# Patient Record
Sex: Male | Born: 1960 | Race: White | Hispanic: No | Marital: Single | Smoking: Never smoker
Health system: Southern US, Community
[De-identification: ages and names within clinical notes are randomized; demographics above are authoritative.]

## PROBLEM LIST (undated history)

## (undated) DIAGNOSIS — K4090 Unilateral inguinal hernia, without obstruction or gangrene, not specified as recurrent: Principal | ICD-10-CM

## (undated) DIAGNOSIS — I10 Essential (primary) hypertension: Secondary | ICD-10-CM

## (undated) HISTORY — DX: Essential (primary) hypertension: I10

## (undated) HISTORY — DX: Unilateral inguinal hernia, without obstruction or gangrene, not specified as recurrent: K40.90

---

## 2009-01-30 ENCOUNTER — Ambulatory Visit: Payer: Self-pay | Admitting: Internal Medicine

## 2009-02-24 ENCOUNTER — Ambulatory Visit: Payer: Self-pay | Admitting: Internal Medicine

## 2009-06-02 ENCOUNTER — Ambulatory Visit: Payer: Self-pay | Admitting: Internal Medicine

## 2009-10-21 ENCOUNTER — Ambulatory Visit: Payer: Self-pay | Admitting: Internal Medicine

## 2010-03-10 ENCOUNTER — Ambulatory Visit: Payer: Self-pay | Admitting: Internal Medicine

## 2010-09-18 ENCOUNTER — Ambulatory Visit: Payer: Self-pay | Admitting: Internal Medicine

## 2010-12-06 HISTORY — PX: INGUINAL HERNIA REPAIR: SUR1180

## 2010-12-06 HISTORY — PX: INCISIONAL HERNIA REPAIR: SHX193

## 2011-02-04 DIAGNOSIS — K4090 Unilateral inguinal hernia, without obstruction or gangrene, not specified as recurrent: Secondary | ICD-10-CM

## 2011-02-04 HISTORY — DX: Unilateral inguinal hernia, without obstruction or gangrene, not specified as recurrent: K40.90

## 2011-02-11 ENCOUNTER — Ambulatory Visit (INDEPENDENT_AMBULATORY_CARE_PROVIDER_SITE_OTHER): Payer: BC Managed Care – PPO | Admitting: Internal Medicine

## 2011-02-11 DIAGNOSIS — K4090 Unilateral inguinal hernia, without obstruction or gangrene, not specified as recurrent: Secondary | ICD-10-CM

## 2011-02-22 ENCOUNTER — Encounter: Payer: Self-pay | Admitting: *Deleted

## 2011-02-22 DIAGNOSIS — K4090 Unilateral inguinal hernia, without obstruction or gangrene, not specified as recurrent: Secondary | ICD-10-CM

## 2011-02-22 DIAGNOSIS — I10 Essential (primary) hypertension: Secondary | ICD-10-CM | POA: Insufficient documentation

## 2011-03-19 ENCOUNTER — Encounter (INDEPENDENT_AMBULATORY_CARE_PROVIDER_SITE_OTHER): Payer: BC Managed Care – PPO | Admitting: Internal Medicine

## 2011-03-19 DIAGNOSIS — Z0000 Encounter for general adult medical examination without abnormal findings: Secondary | ICD-10-CM

## 2011-04-22 ENCOUNTER — Telehealth: Payer: Self-pay | Admitting: Internal Medicine

## 2011-04-22 NOTE — Telephone Encounter (Signed)
Go to drug store and purchase a small bottle of Hibiclens. Lather up from neck down and leave on for 5 minutes. Shower night before surgery and day of surgery with this and continue using for a few days after surgery to prevent Staph infection

## 2011-04-22 NOTE — Telephone Encounter (Signed)
Spoke with patient and gave him directions per Dr. Lenord Fellers.  Pt denied any questions or concerns.

## 2011-04-27 ENCOUNTER — Other Ambulatory Visit: Payer: Self-pay | Admitting: General Surgery

## 2011-04-27 ENCOUNTER — Ambulatory Visit
Admission: RE | Admit: 2011-04-27 | Discharge: 2011-04-27 | Disposition: A | Discharge status: Home / Self Care | Payer: BC Managed Care – PPO | Source: Ambulatory Visit | Attending: General Surgery | Admitting: General Surgery

## 2011-04-27 DIAGNOSIS — Z01811 Encounter for preprocedural respiratory examination: Secondary | ICD-10-CM

## 2011-05-24 ENCOUNTER — Encounter (INDEPENDENT_AMBULATORY_CARE_PROVIDER_SITE_OTHER): Payer: Self-pay | Admitting: General Surgery

## 2011-06-03 ENCOUNTER — Encounter: Payer: Self-pay | Admitting: *Deleted

## 2011-10-21 ENCOUNTER — Ambulatory Visit (INDEPENDENT_AMBULATORY_CARE_PROVIDER_SITE_OTHER): Payer: BC Managed Care – PPO | Admitting: Internal Medicine

## 2011-10-21 DIAGNOSIS — Z23 Encounter for immunization: Secondary | ICD-10-CM

## 2011-12-14 ENCOUNTER — Other Ambulatory Visit: Payer: Self-pay | Admitting: Internal Medicine

## 2012-05-05 ENCOUNTER — Other Ambulatory Visit: Payer: Self-pay | Admitting: Internal Medicine

## 2012-07-25 IMAGING — CR DG CHEST 2 VIEW
2 series · 2 of 2 positions shown · non-contrast
Comparison: None

CLINICAL DATA: Preop hernia repair.

CHEST - 2 VIEW

[w chest pa]
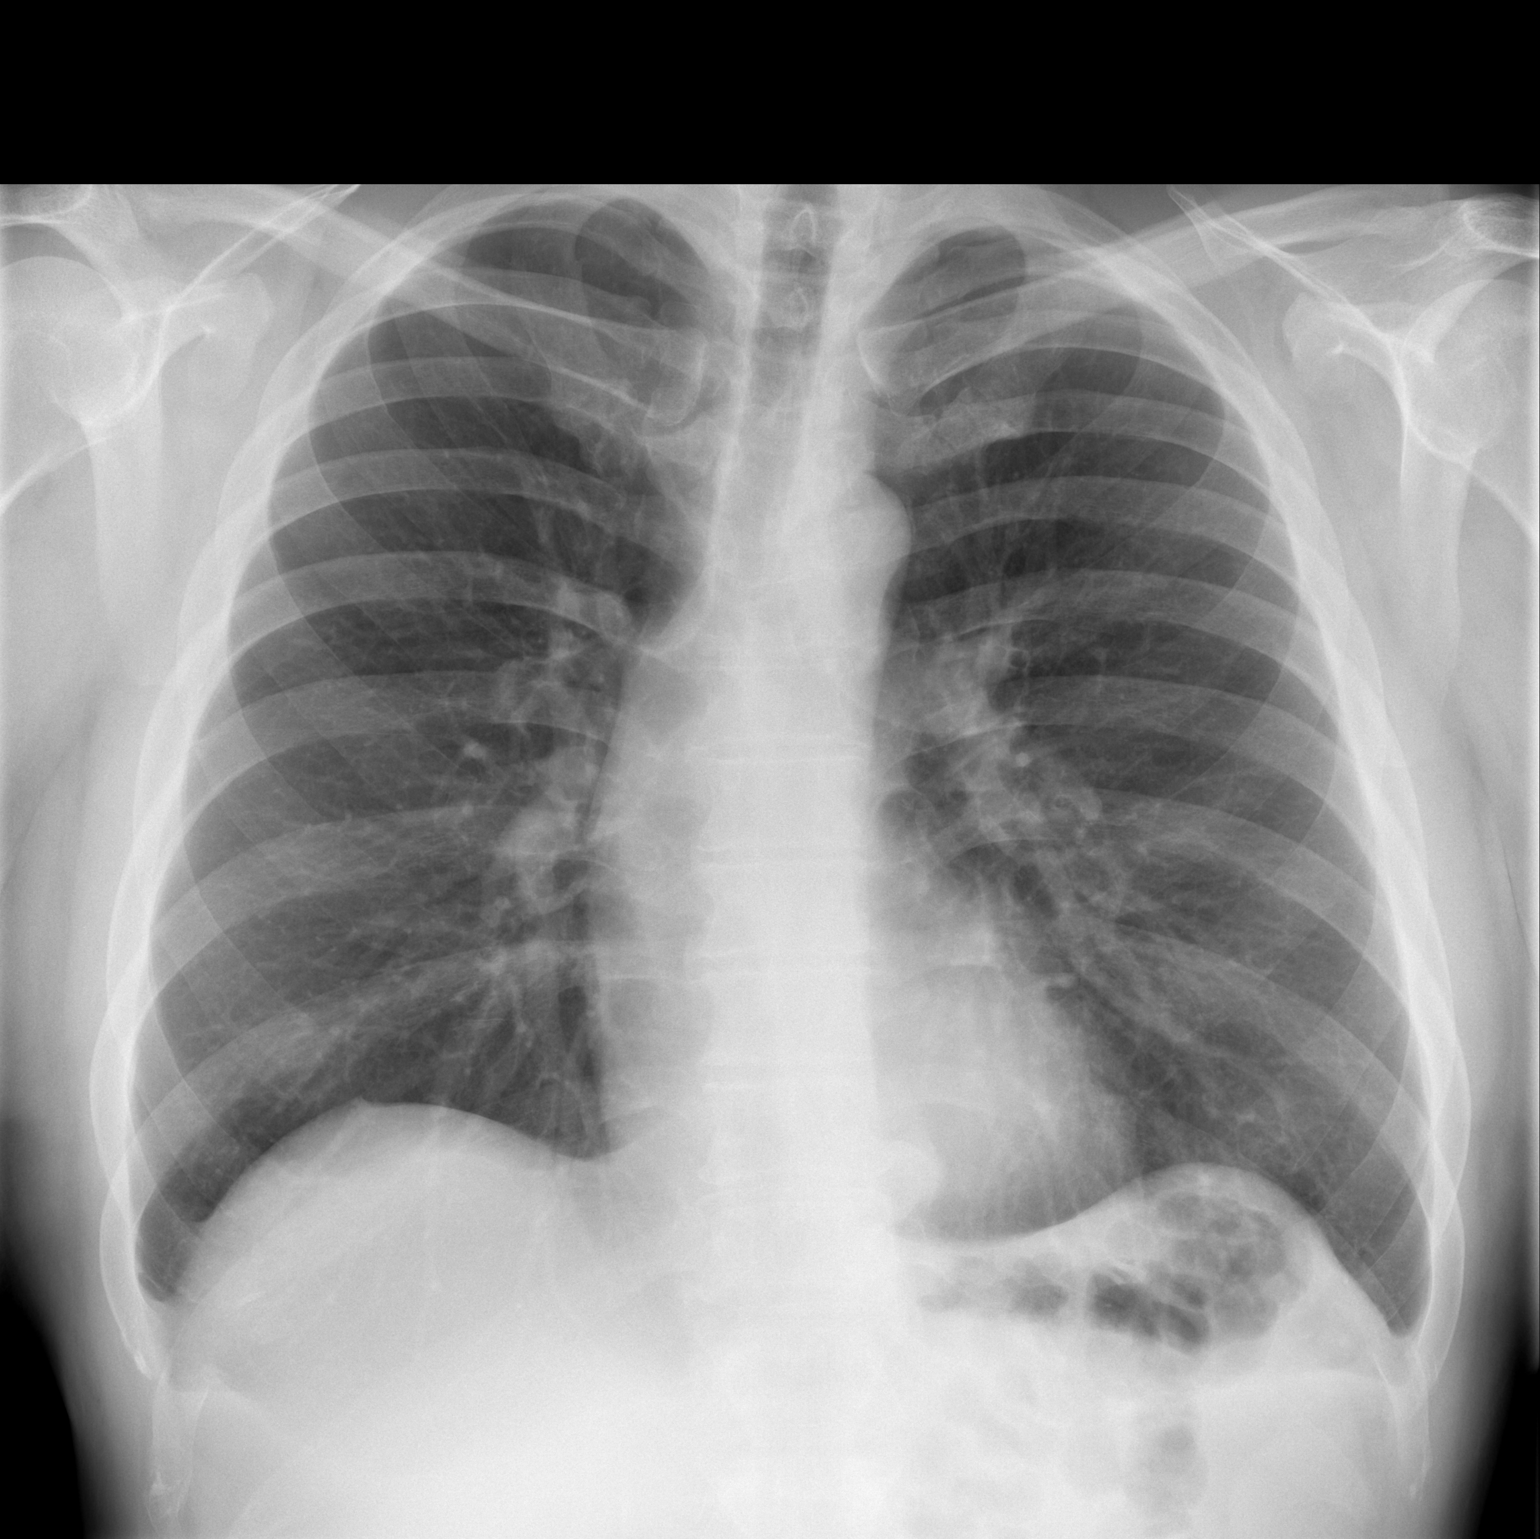

[w chest lat]
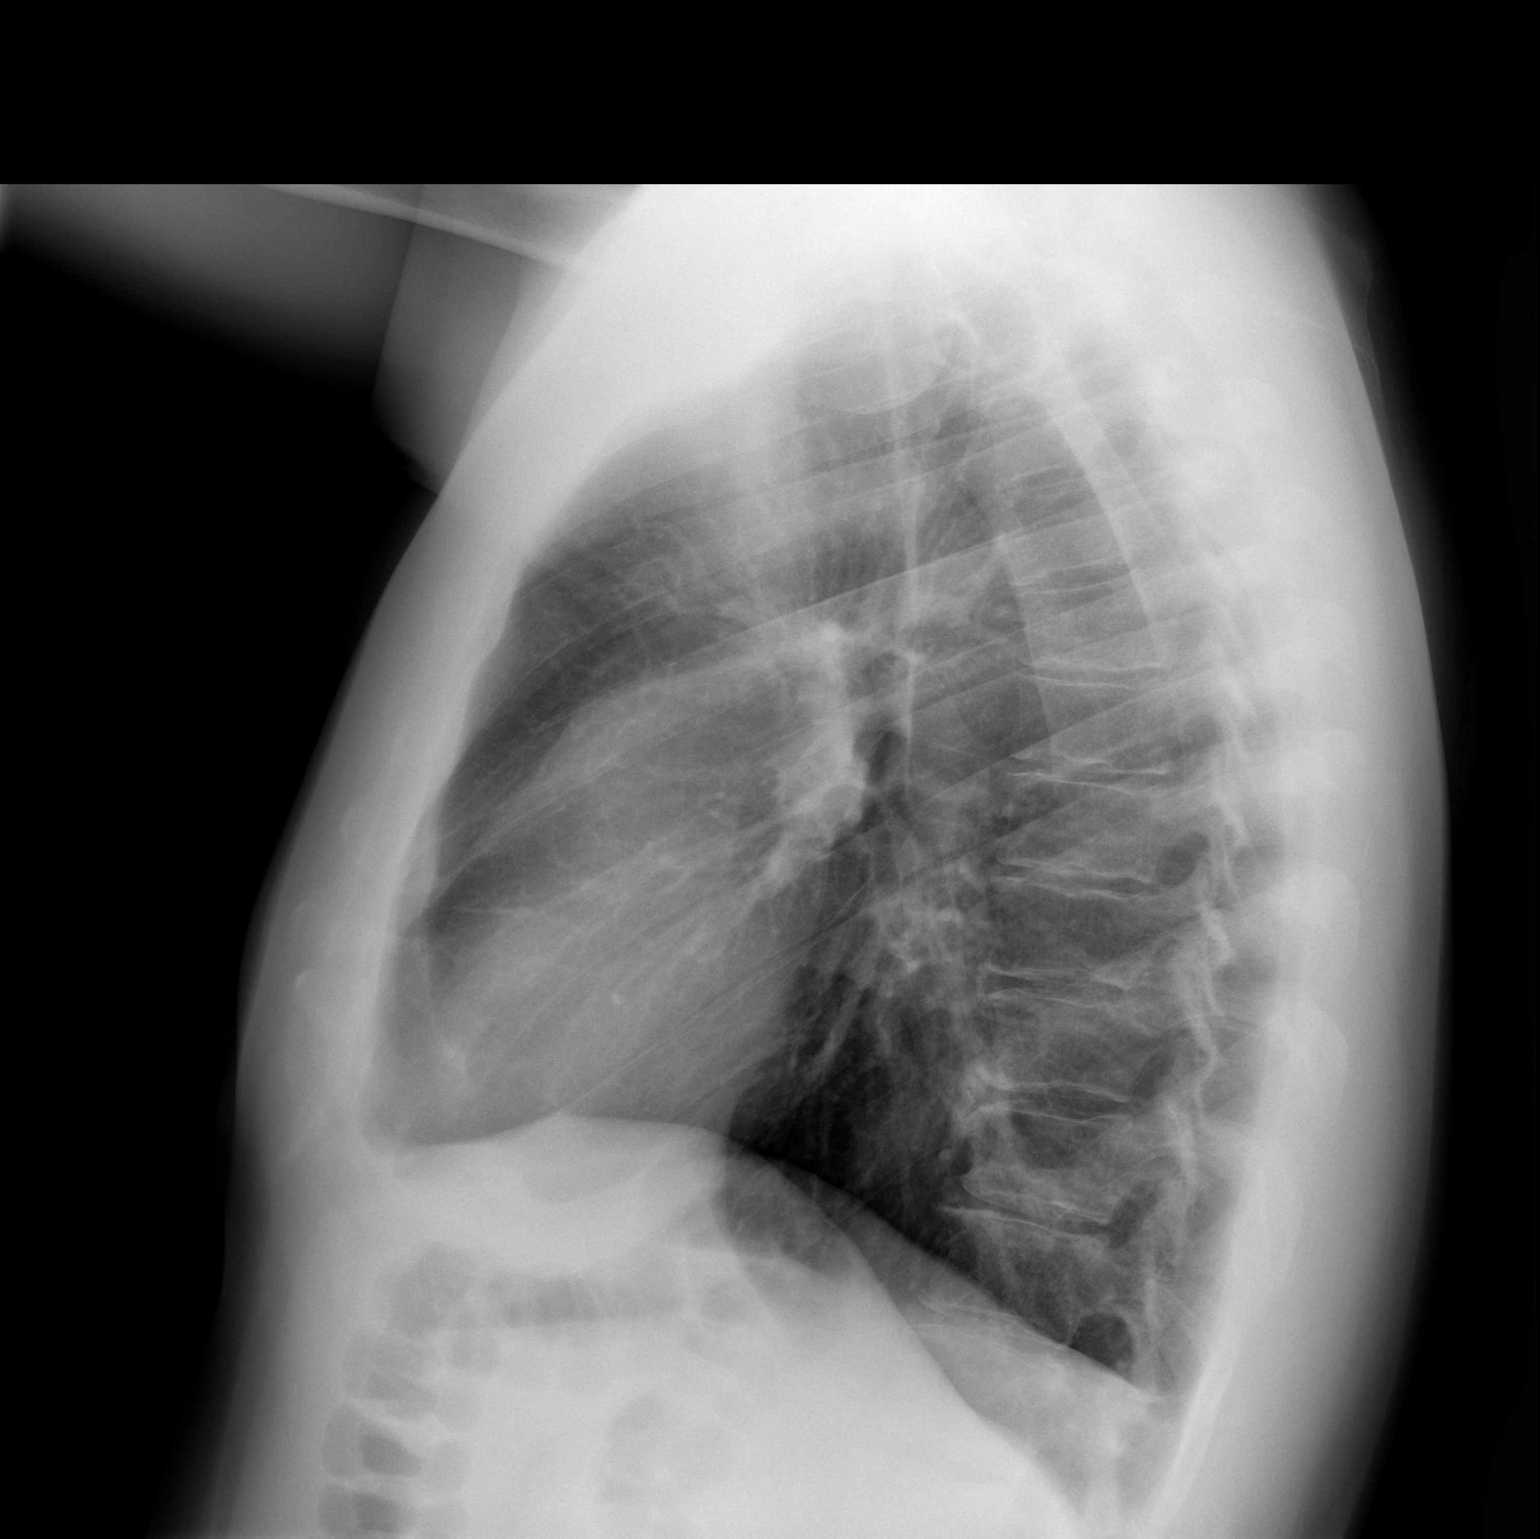

[2 of 2 positions shown; findings below may reference images not displayed]

FINDINGS: Heart and mediastinal contours are within normal limits.
No focal opacities or effusions.  No acute bony abnormality.
Slight rightward scoliosis of the thoracic spine.
IMPRESSION: No active cardiopulmonary disease.

## 2012-09-18 ENCOUNTER — Other Ambulatory Visit: Payer: Self-pay

## 2012-09-18 ENCOUNTER — Other Ambulatory Visit: Payer: Self-pay | Admitting: Internal Medicine

## 2012-09-18 MED ORDER — RAMIPRIL 5 MG PO CAPS: 5.0000 mg | ORAL_CAPSULE | Freq: Every day | ORAL | Status: DC

## 2012-09-18 NOTE — Telephone Encounter (Signed)
See when due for CPE

## 2012-10-05 ENCOUNTER — Other Ambulatory Visit: Payer: Self-pay | Admitting: Internal Medicine

## 2012-10-05 ENCOUNTER — Other Ambulatory Visit: Payer: BC Managed Care – PPO | Admitting: Internal Medicine

## 2012-10-05 DIAGNOSIS — I10 Essential (primary) hypertension: Secondary | ICD-10-CM

## 2012-10-05 DIAGNOSIS — Z125 Encounter for screening for malignant neoplasm of prostate: Secondary | ICD-10-CM

## 2012-10-05 LAB — CBC WITH DIFFERENTIAL/PLATELET
Eosinophils Relative: 1 % (ref 0–5)
HCT: 43.7 % (ref 39.0–52.0)
Lymphocytes Relative: 29 % (ref 12–46)
Lymphs Abs: 1.8 10*3/uL (ref 0.7–4.0)
MCV: 91.6 fL (ref 78.0–100.0)
Platelets: 226 10*3/uL (ref 150–400)
RBC: 4.77 MIL/uL (ref 4.22–5.81)
WBC: 6.2 10*3/uL (ref 4.0–10.5)

## 2012-10-05 LAB — COMPREHENSIVE METABOLIC PANEL
ALT: 14 U/L (ref 0–53)
Albumin: 4.5 g/dL (ref 3.5–5.2)
CO2: 25 mEq/L (ref 19–32)
Calcium: 8.8 mg/dL (ref 8.4–10.5)
Chloride: 100 mEq/L (ref 96–112)
Creat: 0.81 mg/dL (ref 0.50–1.35)
Total Protein: 6.6 g/dL (ref 6.0–8.3)

## 2012-10-05 LAB — LIPID PANEL
Cholesterol: 196 mg/dL (ref 0–200)
Total CHOL/HDL Ratio: 1.9 Ratio

## 2012-10-06 ENCOUNTER — Encounter: Payer: Self-pay | Admitting: Internal Medicine

## 2012-10-06 ENCOUNTER — Ambulatory Visit (INDEPENDENT_AMBULATORY_CARE_PROVIDER_SITE_OTHER): Payer: BC Managed Care – PPO | Admitting: Internal Medicine

## 2012-10-06 VITALS — BP 142/90 | HR 76 | Temp 98.3°F | Ht 71.0 in | Wt 210.0 lb

## 2012-10-06 DIAGNOSIS — I10 Essential (primary) hypertension: Secondary | ICD-10-CM

## 2012-10-06 DIAGNOSIS — Z23 Encounter for immunization: Secondary | ICD-10-CM

## 2012-10-06 LAB — POCT URINALYSIS DIPSTICK
Bilirubin, UA: NEGATIVE
Glucose, UA: NEGATIVE
Ketones, UA: NEGATIVE
Leukocytes, UA: NEGATIVE

## 2012-10-06 MED ORDER — TETANUS-DIPHTH-ACELL PERTUSSIS 5-2.5-18.5 LF-MCG/0.5 IM SUSP: 0.5000 mL | Freq: Once | INTRAMUSCULAR | Status: DC

## 2012-11-03 ENCOUNTER — Encounter: Payer: Self-pay | Admitting: Internal Medicine

## 2012-11-03 NOTE — Progress Notes (Signed)
  Subjective:    Patient ID: Matthew Huff, male    DOB: [birth year], 51 y.o.   MRN: [id number]  HPI 51 year old white male with history of hypertension in today for health maintenance exam. Tells me that his mother is doing poorly and is in hospice care because of end-stage congestive heart failure. His daughter is doing well. She is a Engineer, civil (consulting) at [employer] hospital in the neonatal unit. She likes it very much and recently became engaged. 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 He enjoys hiking for exercise. He's been doing that now for several years and lost a considerable amount of weight doing that. His father has dementia. Patient is divorced. Blood pressure is elevated today because he is worried about his mother. Generally blood pressure is excellent with a large cuff. He had left inguinal hernia repair March 2012.  He fractured right arm at 51 years of age and right ankle at 51 years of age. No known drug allergies. History of hyperlipidemia but once he lost weight he was able to discontinue Lipitor.  Family history: Mother has history of diabetes mellitus and coronary artery disease in addition to end-stage congestive heart failure  Father with history of hypertension arteriosclerotic cardiovascular disease and dementia  One sister overweight  Patient does not smoke. Social alcohol consumption.     Review of Systems  Constitutional: Negative.   All other systems reviewed and are negative.       Objective:   Physical Exam  Vitals reviewed. Constitutional: He is oriented to person, place, and time. He appears well-developed and well-nourished. No distress.  HENT:  Head: Normocephalic and atraumatic.  Right Ear: External ear normal.  Left Ear: External ear normal.  Mouth/Throat: Oropharynx is clear and moist.  Eyes: Conjunctivae normal and EOM are normal. Pupils are equal, round, and reactive to light. Right eye exhibits no discharge. Left eye  exhibits no discharge.  Neck: Neck supple. No JVD present. No tracheal deviation present.  Cardiovascular: Normal rate, regular rhythm, normal heart sounds and intact distal pulses.   No murmur heard. Pulmonary/Chest: Effort normal and breath sounds normal. No respiratory distress. He has no wheezes. He has no rales. He exhibits no tenderness.  Abdominal: Soft. Bowel sounds are normal.  Genitourinary: Prostate normal.       No hernias  Musculoskeletal: Normal range of motion. He exhibits no edema and no tenderness.  Lymphadenopathy:    He has no cervical adenopathy.  Neurological: He is alert and oriented to person, place, and time. He has normal reflexes. No cranial nerve deficit. Coordination normal.  Skin: Skin is warm and dry. No rash noted. He is not diaphoretic.  Psychiatric: He has a normal mood and affect. His behavior is normal. Judgment and thought content normal.          Assessment & Plan:  Hypertension  Grief reaction due to mother's illness with elevated blood pressure today because of grief  Plan: Return in 6 months. Patient is to monitor his blood pressure at home and call me if it remains elevated for prolonged period. Refill antihypertensive medication.

## 2012-11-03 NOTE — Patient Instructions (Addendum)
Continue antihypertensive medication and return in 6 months 

## 2013-01-10 ENCOUNTER — Telehealth: Payer: Self-pay | Admitting: Internal Medicine

## 2013-01-11 ENCOUNTER — Other Ambulatory Visit: Payer: Self-pay

## 2013-01-11 MED ORDER — RAMIPRIL 5 MG PO CAPS: 5.0000 mg | ORAL_CAPSULE | Freq: Every day | ORAL | Status: DC

## 2013-03-06 ENCOUNTER — Encounter: Payer: Self-pay | Admitting: Internal Medicine

## 2013-03-06 ENCOUNTER — Ambulatory Visit (INDEPENDENT_AMBULATORY_CARE_PROVIDER_SITE_OTHER): Payer: BC Managed Care – PPO | Admitting: Internal Medicine

## 2013-03-06 DIAGNOSIS — S61409A Unspecified open wound of unspecified hand, initial encounter: Secondary | ICD-10-CM

## 2013-03-06 DIAGNOSIS — S61452A Open bite of left hand, initial encounter: Secondary | ICD-10-CM

## 2013-03-06 DIAGNOSIS — W540XXA Bitten by dog, initial encounter: Secondary | ICD-10-CM

## 2013-03-06 MED ORDER — DOXYCYCLINE HYCLATE 100 MG PO TABS: 100.0000 mg | ORAL_TABLET | Freq: Two times a day (BID) | ORAL | Status: DC

## 2013-03-06 MED ORDER — MUPIROCIN 2 % EX OINT: TOPICAL_OINTMENT | Freq: Two times a day (BID) | CUTANEOUS | Status: DC

## 2013-03-06 NOTE — Patient Instructions (Addendum)
Clean wounds with peroxide daily. Soak left hand in warm soapy water for 20 minutes daily. Apply Bactroban ointment to puncture wounds and dress sterilely for work. Take doxycycline 100 mg twice daily for 10 days.

## 2013-03-06 NOTE — Progress Notes (Signed)
  Subjective:    Patient ID: Matthew Huff, male    DOB: 1961-02-13, 52 y.o.   MRN: [id number]  HPI Patient was bitten by family dog on Sunday, March 30. Dog's rabies vaccine is up-to-date. He cleaned it with peroxide and washed it well. It swelled up. 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 Able to do his work over the past couple of days. No drainage. No fever or chills. Tetanus immunization is up-to-date.    Review of Systems     Objective:   Physical Exam patient has a 1 cm laceration base of left thumb dorsal aspect of left hand. He also has a smaller puncture wound palmar aspect base of the left thumb. No increased warmth. There is swelling at the base of the thumb. Full range of motion in left thumb and muscle strength is normal. Pulse intact in wrist. Sensation intact in hand.        Assessment & Plan:  Dog bite left hand base of left thumb  Plan: Doxycycline 100 mg twice daily for 10 days. Clean area with peroxide daily and apply Bactroban ointment twice daily. Soak hand in warm soapy water for 20 minutes daily. Keep wounds dressed while at work. Prescribe Bactroban ointment to use on laceration and puncture wound twice daily.

## 2013-04-09 ENCOUNTER — Ambulatory Visit: Payer: BC Managed Care – PPO | Admitting: Internal Medicine

## 2013-08-15 ENCOUNTER — Telehealth: Payer: Self-pay | Admitting: Internal Medicine

## 2013-08-16 NOTE — Telephone Encounter (Signed)
Spoke with Chad @ [facility] [phone] per Dr. Beryle Quant order.  Sterapred DS as listed in note.  Advised patient of Rx.

## 2013-08-16 NOTE — Telephone Encounter (Signed)
Call in Sterapred DS 10 mg 6 day dosepack #21 tabs Take in 6-5-4-3-2-1- tapering dose.

## 2013-09-26 ENCOUNTER — Ambulatory Visit (INDEPENDENT_AMBULATORY_CARE_PROVIDER_SITE_OTHER): Payer: BC Managed Care – PPO | Admitting: Internal Medicine

## 2013-09-26 DIAGNOSIS — Z23 Encounter for immunization: Secondary | ICD-10-CM

## 2013-10-11 ENCOUNTER — Telehealth: Payer: Self-pay | Admitting: Internal Medicine

## 2013-10-11 NOTE — Telephone Encounter (Signed)
CPE Labs 12/11/13 @ 0900 CPE 12/13/13 @ 1100 Advised patient we'll refill the Ramipril until he sees her in January.    LM for pharmacy @ [phone] [facility] Ramipril 5mg .  Take 1 capsule by mouth daily.  #90, no refills.

## 2013-12-11 ENCOUNTER — Other Ambulatory Visit: Payer: BC Managed Care – PPO | Admitting: Internal Medicine

## 2013-12-11 DIAGNOSIS — Z13220 Encounter for screening for lipoid disorders: Secondary | ICD-10-CM

## 2013-12-11 DIAGNOSIS — I10 Essential (primary) hypertension: Secondary | ICD-10-CM

## 2013-12-11 DIAGNOSIS — Z130 Encounter for screening for diseases of the blood and blood-forming organs and certain disorders involving the immune mechanism: Secondary | ICD-10-CM

## 2013-12-11 DIAGNOSIS — Z125 Encounter for screening for malignant neoplasm of prostate: Secondary | ICD-10-CM

## 2013-12-11 LAB — CBC WITH DIFFERENTIAL/PLATELET
Basophils Absolute: 0 10*3/uL (ref 0.0–0.1)
Basophils Relative: 0 % (ref 0–1)
EOS ABS: 0.1 10*3/uL (ref 0.0–0.7)
EOS PCT: 1 % (ref 0–5)
HEMATOCRIT: 42.8 % (ref 39.0–52.0)
Hemoglobin: 14.8 g/dL (ref 13.0–17.0)
LYMPHS ABS: 1.7 10*3/uL (ref 0.7–4.0)
LYMPHS PCT: 21 % (ref 12–46)
MCH: 32.1 pg (ref 26.0–34.0)
MCHC: 34.6 g/dL (ref 30.0–36.0)
MCV: 92.8 fL (ref 78.0–100.0)
MONO ABS: 0.7 10*3/uL (ref 0.1–1.0)
Monocytes Relative: 9 % (ref 3–12)
Neutro Abs: 5.7 10*3/uL (ref 1.7–7.7)
Neutrophils Relative %: 69 % (ref 43–77)
Platelets: 284 10*3/uL (ref 150–400)
RBC: 4.61 MIL/uL (ref 4.22–5.81)
RDW: 12.7 % (ref 11.5–15.5)
WBC: 8.2 10*3/uL (ref 4.0–10.5)

## 2013-12-11 LAB — COMPREHENSIVE METABOLIC PANEL
ALT: 12 U/L (ref 0–53)
AST: 14 U/L (ref 0–37)
Albumin: 4.4 g/dL (ref 3.5–5.2)
Alkaline Phosphatase: 43 U/L (ref 39–117)
BILIRUBIN TOTAL: 0.5 mg/dL (ref 0.3–1.2)
BUN: 5 mg/dL — AB (ref 6–23)
CALCIUM: 8.8 mg/dL (ref 8.4–10.5)
CHLORIDE: 101 meq/L (ref 96–112)
CO2: 28 meq/L (ref 19–32)
CREATININE: 0.71 mg/dL (ref 0.50–1.35)
GLUCOSE: 85 mg/dL (ref 70–99)
Potassium: 3.7 mEq/L (ref 3.5–5.3)
Sodium: 139 mEq/L (ref 135–145)
Total Protein: 6.4 g/dL (ref 6.0–8.3)

## 2013-12-11 LAB — LIPID PANEL
CHOL/HDL RATIO: 2 ratio
CHOLESTEROL: 170 mg/dL (ref 0–200)
HDL: 84 mg/dL (ref >39)
LDL Cholesterol: 76 mg/dL (ref 0–99)
TRIGLYCERIDES: 51 mg/dL (ref <150)
VLDL: 10 mg/dL (ref 0–40)

## 2013-12-11 LAB — PSA: PSA: 1.9 ng/mL (ref <=4.00)

## 2013-12-13 ENCOUNTER — Encounter: Payer: Self-pay | Admitting: Internal Medicine

## 2013-12-13 ENCOUNTER — Ambulatory Visit (INDEPENDENT_AMBULATORY_CARE_PROVIDER_SITE_OTHER): Payer: BC Managed Care – PPO | Admitting: Internal Medicine

## 2013-12-13 VITALS — BP 160/100 | HR 80 | Temp 99.2°F | Ht 71.0 in | Wt 209.0 lb

## 2013-12-13 DIAGNOSIS — I10 Essential (primary) hypertension: Secondary | ICD-10-CM

## 2013-12-13 DIAGNOSIS — J111 Influenza due to unidentified influenza virus with other respiratory manifestations: Secondary | ICD-10-CM

## 2013-12-13 LAB — POCT URINALYSIS DIPSTICK
BILIRUBIN UA: NEGATIVE
Glucose, UA: NEGATIVE
KETONES UA: NEGATIVE
LEUKOCYTES UA: NEGATIVE
Nitrite, UA: NEGATIVE
PH UA: 7.5
Protein, UA: NEGATIVE
RBC UA: NEGATIVE
SPEC GRAV UA: 1.02
Urobilinogen, UA: NEGATIVE

## 2013-12-13 MED ORDER — BENZONATATE 100 MG PO CAPS: ORAL_CAPSULE | ORAL | Status: DC

## 2013-12-13 MED ORDER — AZITHROMYCIN 250 MG PO TABS
ORAL_TABLET | ORAL | Status: DC
Start: 2013-12-13 — End: 2014-06-03

## 2013-12-13 NOTE — Patient Instructions (Addendum)
Call with BP readings in 2 weeks. Take Levaquin and Tessalon perles as directed. Consider screening colonoscopy.

## 2014-01-17 ENCOUNTER — Other Ambulatory Visit: Payer: Self-pay | Admitting: Internal Medicine

## 2014-06-03 ENCOUNTER — Encounter: Payer: Self-pay | Admitting: Internal Medicine

## 2014-06-03 ENCOUNTER — Ambulatory Visit (INDEPENDENT_AMBULATORY_CARE_PROVIDER_SITE_OTHER): Payer: BC Managed Care – PPO | Admitting: Internal Medicine

## 2014-06-03 VITALS — BP 150/88 | HR 92 | Temp 98.9°F | Wt 206.0 lb

## 2014-06-03 DIAGNOSIS — S91309A Unspecified open wound, unspecified foot, initial encounter: Secondary | ICD-10-CM

## 2014-06-03 DIAGNOSIS — I10 Essential (primary) hypertension: Secondary | ICD-10-CM

## 2014-06-03 DIAGNOSIS — S91332A Puncture wound without foreign body, left foot, initial encounter: Secondary | ICD-10-CM

## 2014-06-03 DIAGNOSIS — F4321 Adjustment disorder with depressed mood: Secondary | ICD-10-CM

## 2014-06-03 MED ORDER — LEVOFLOXACIN 500 MG PO TABS: 500.0000 mg | ORAL_TABLET | Freq: Every day | ORAL | Status: DC

## 2014-06-03 NOTE — Progress Notes (Signed)
   Subjective:    Patient ID: Matthew Huff, male    DOB: 1961/11/05, 53 y.o.   MRN: [id number]  HPI  Patient accidentally dropped a jar of jelly out of his refrigerator onto the floor at home on Thursday, June 25. Thinks he may have stepped on a piece of glass. Has puncture one in left heel plantar aspect. Doesn't feel that a piece of glass is left in there but became concerned about the puncture wound. His tetanus immunization is up-to-date.  Father died about a month ago due to complications of heart block, bowel obstruction and pneumonia. He was 52 years old.  Patient has not been taking antihypertensive medication regularly. Did not take it this past weekend and blood pressure is elevated today.    Review of Systems     Objective:   Physical Exam With a scalpel, I pared the puncture area down somewhat and did not find any piece of glass. Pt did not feel any piece of glass to his touch either. No evidence of abscess.       Assessment & Plan:  Puncture wound left heel  Hypertension- take blood pressure medicine regularly and continue to monitor and home. Call if persistently elevated on current medication  Grief reaction due to loss of father  Plan: Levaquin 500 milligrams daily for 7 days. He is to soak left foot in warm soapy water for 20 minutes daily. Keep puncture area covered and clean for 5-7 days. Tetanus immunization is up-to-date.

## 2014-06-03 NOTE — Patient Instructions (Addendum)
Keep wound clean and dry and covered for several days. Take Levaquin 500 milligrams daily for 7 days with a meal. Soak left foot in warm soapy water for 20 minutes daily. Tetanus immunization is up-to-date. Take blood pressure medication on a regular basis.

## 2014-06-11 ENCOUNTER — Encounter: Payer: Self-pay | Admitting: Internal Medicine

## 2014-06-11 NOTE — Progress Notes (Signed)
   Subjective:    Patient ID: Matthew Huff, male    DOB: [birth year], 53 y.o.   MRN: [id number]  HPI  53 year old White male in today for health maintenance exam. He has a history of hypertension treated with Ramipril 5 mg daily. Blood pressure is elevated today but he's not feeling well. He succumbed down with flu symptoms and respiratory infection. Says blood pressures been under good control when he checked it at home over the past several weeks.  Past medical history: Fractured right arm in 53 years of age. Fractured right ankle at 53 years of age. Used to be a Lipitor for hyperlipidemia but once he lost weight by diet and exercise he was able to discontinue Lipitor. Left inguinal hernia repair March 2012.  Social history: He is divorced. 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 Enjoys hiking for exercise. One daughter who is a Engineer, civil (consulting)nurse. Patient does not smoke. Social alcohol consumption.  Family history: Mother died of end-stage congestive heart failure. Father with history of dementia and hypertensive arteriosclerotic cardiovascular disease. One sister overweight.      Review of Systems  Constitutional: Positive for fever.  HENT: Positive for congestion.   Respiratory: Positive for cough.   Cardiovascular: Negative.   Gastrointestinal: Negative.   Genitourinary: Negative.   Neurological: Negative.   Hematological: Negative.   Psychiatric/Behavioral: Negative.        Objective:   Physical Exam  Vitals reviewed. Constitutional: He is oriented to person, place, and time. He appears well-developed and well-nourished. No distress.  HENT:  Head: Normocephalic and atraumatic.  Right Ear: External ear normal.  Left Ear: External ear normal.  Mouth/Throat: Oropharynx is clear and moist. No oropharyngeal exudate.  Eyes: Conjunctivae and EOM are normal. Pupils are equal, round, and reactive to light. Right eye exhibits no discharge. Left eye exhibits no discharge. No  scleral icterus.  Neck: Neck supple. No JVD present. No thyromegaly present.  Cardiovascular: Normal rate, regular rhythm, normal heart sounds and intact distal pulses.   No murmur heard. Pulmonary/Chest: Effort normal and breath sounds normal. He has no wheezes. He has no rales.  Abdominal: Soft. Bowel sounds are normal. He exhibits no distension and no mass. There is no tenderness. There is no rebound and no guarding.  Genitourinary: Prostate normal.  Musculoskeletal: Normal range of motion. He exhibits no edema.  Neurological: He is alert and oriented to person, place, and time. He has normal reflexes. No cranial nerve deficit. Coordination normal.  Skin: Skin is warm and dry. No rash noted. He is not diaphoretic.  Psychiatric: He has a normal mood and affect. His behavior is normal. Judgment and thought content normal.          Assessment & Plan:  Influenza with secondary bacterial infection/bronchitis  Hypertension-blood pressure  elevated today because of illness  Plan: Patient is to call with blood pressure readings in the next 2 weeks. Continue Ramipril 5 mg daily. Levaquin 500 milligrams daily for bronchitis and Tessalon Perles 200 mg 3 times daily as needed for cough.

## 2014-06-20 ENCOUNTER — Ambulatory Visit: Payer: BC Managed Care – PPO | Admitting: Internal Medicine

## 2014-06-24 ENCOUNTER — Encounter: Payer: Self-pay | Admitting: Internal Medicine

## 2014-06-24 ENCOUNTER — Ambulatory Visit (INDEPENDENT_AMBULATORY_CARE_PROVIDER_SITE_OTHER): Payer: BC Managed Care – PPO | Admitting: Internal Medicine

## 2014-06-24 VITALS — BP 144/94 | HR 76 | Temp 98.7°F | Wt 206.5 lb

## 2014-06-24 DIAGNOSIS — I10 Essential (primary) hypertension: Secondary | ICD-10-CM

## 2014-06-24 DIAGNOSIS — F4321 Adjustment disorder with depressed mood: Secondary | ICD-10-CM

## 2014-06-24 DIAGNOSIS — F4320 Adjustment disorder, unspecified: Secondary | ICD-10-CM

## 2014-06-24 MED ORDER — RAMIPRIL 10 MG PO CAPS: 10.0000 mg | ORAL_CAPSULE | Freq: Every day | ORAL | Status: DC

## 2014-06-24 NOTE — Progress Notes (Signed)
   Subjective:    Patient ID: Matthew Huff, male    DOB: [birth year], 53 y.o.   MRN: [id number]  HPI At last visit he felt like there might be a piece of glass in his heel. The area was pared down but no foreign body located. He changed inserts in his shoes andsymptoms improved. Continues to hike on weekends. This past weekend went up to 2000 feet. His been riding his bike.still grieving the loss of his father recently. His been trying to clean out father's home. His blood pressure is elevated today. He's been on same dose of Ramapril for long period of time. He is compliant with his medication.    Review of Systems     Objective:   Physical Exam not examined today spoke with patient for 15 minutes regarding blood pressure management        Assessment & Plan:  Essential hypertension  Grief reaction  Plan: Increase Ramapril to 10 mg daily and return in 3 weeks for office visit blood pressure check and basic metabolic panel

## 2014-06-24 NOTE — Patient Instructions (Signed)
Increase Altace to 10 mg daily and return in 3 weeks for office visit b- met and blood pressure check

## 2014-07-25 ENCOUNTER — Ambulatory Visit (INDEPENDENT_AMBULATORY_CARE_PROVIDER_SITE_OTHER): Payer: BC Managed Care – PPO | Admitting: Internal Medicine

## 2014-07-25 ENCOUNTER — Encounter: Payer: Self-pay | Admitting: Internal Medicine

## 2014-07-25 VITALS — BP 130/90 | Wt 209.0 lb

## 2014-07-25 DIAGNOSIS — I10 Essential (primary) hypertension: Secondary | ICD-10-CM

## 2014-07-25 NOTE — Progress Notes (Signed)
   Subjective:    Patient ID: Matthew Huff, male    DOB: 04-27-1961, 53 y.o.   MRN: [id number]  HPI  53 year old male in today for repeat blood pressure check. At last visit we increased Altace to 10 mg daily. He still grieving his father's death. His dog has medical problems and is 52 years old. He may have to put her down. He is grieving that as well. Hasn't been exercising quite as much the last few weeks hands. His weight has increased a bit.    Review of Systems     Objective:   Physical Exam  Talked with him for 15 minutes about grief reaction      Assessment & Plan:  Grief reaction  Hypertension  Plan: He says he feels a bit upset today. He came in during work and is going back to work. I did not change his medication today and he will return in 8 weeks for followup.

## 2014-07-25 NOTE — Patient Instructions (Signed)
Continue same medication Altace 10 mg daily return in 8 weeks.

## 2014-07-26 ENCOUNTER — Other Ambulatory Visit: Payer: Self-pay | Admitting: Internal Medicine

## 2014-07-26 MED ORDER — RAMIPRIL 10 MG PO CAPS: 10.0000 mg | ORAL_CAPSULE | Freq: Every day | ORAL | Status: DC

## 2014-07-26 NOTE — Telephone Encounter (Signed)
Change dose to 10 mg daily #90 with no refill

## 2014-09-19 ENCOUNTER — Encounter: Payer: Self-pay | Admitting: Internal Medicine

## 2014-09-19 ENCOUNTER — Ambulatory Visit (INDEPENDENT_AMBULATORY_CARE_PROVIDER_SITE_OTHER): Payer: BC Managed Care – PPO | Admitting: Internal Medicine

## 2014-09-19 VITALS — BP 140/90

## 2014-09-19 DIAGNOSIS — I10 Essential (primary) hypertension: Secondary | ICD-10-CM

## 2014-09-19 DIAGNOSIS — M25511 Pain in right shoulder: Secondary | ICD-10-CM

## 2014-09-19 DIAGNOSIS — M25512 Pain in left shoulder: Secondary | ICD-10-CM | POA: Insufficient documentation

## 2014-09-19 MED ORDER — MELOXICAM 15 MG PO TABS: 15.0000 mg | ORAL_TABLET | Freq: Every day | ORAL | Status: DC

## 2014-09-19 MED ORDER — AMLODIPINE BESYLATE 5 MG PO TABS: 5.0000 mg | ORAL_TABLET | Freq: Every day | ORAL | Status: DC

## 2014-09-19 NOTE — Progress Notes (Signed)
   Subjective:    Patient ID: Matthew PigeonRalph Edward Huff, male    DOB: [birth year], 53 y.o.   MRN: [id number]  HPI  Patient in today for blood pressure check. Blood pressure remains elevated. He is on Ramipril. Still working hard on weekends to clean out father's house. Still grieving father's death. Says he is drinking a lot of water. Having some issues with bilateral shoulder pain left worse than the right. 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 Occasionally will take over-the-counter analgesic. He does a fair amount of hiking.    Review of Systems     Objective:   Physical Exam  spent 15 minutes speaking with patient about these issues. Have commenced him to try an additional blood pressure medication.       Assessment & Plan:  Hypertension-add Norvasc 5 mg daily to Ramipril. Return in 2 months.  Bilateral shoulder pain left worse than right-begin Mobic 15 mg daily  Plan: Continue Ramipril 10 mg daily. Add Norvasc 5 mg daily. Take Mobic 15 mg daily for shoulder pain

## 2014-09-19 NOTE — Patient Instructions (Signed)
Start Norvasc 5 mg daily in addition to Altace 10 mg daily. Take Mobic 15 mg daily for left shoulder pain. Return in December. No lab work needed upon return.

## 2014-10-24 ENCOUNTER — Other Ambulatory Visit: Payer: Self-pay | Admitting: Internal Medicine

## 2014-10-29 ENCOUNTER — Ambulatory Visit (INDEPENDENT_AMBULATORY_CARE_PROVIDER_SITE_OTHER): Payer: BC Managed Care – PPO | Admitting: Internal Medicine

## 2014-10-29 DIAGNOSIS — Z23 Encounter for immunization: Secondary | ICD-10-CM

## 2014-11-26 ENCOUNTER — Ambulatory Visit (INDEPENDENT_AMBULATORY_CARE_PROVIDER_SITE_OTHER): Payer: BC Managed Care – PPO | Admitting: Internal Medicine

## 2014-11-26 ENCOUNTER — Encounter: Payer: Self-pay | Admitting: Internal Medicine

## 2014-11-26 VITALS — BP 166/94 | HR 78 | Temp 98.4°F

## 2014-11-26 DIAGNOSIS — I10 Essential (primary) hypertension: Secondary | ICD-10-CM

## 2014-11-26 DIAGNOSIS — M25512 Pain in left shoulder: Secondary | ICD-10-CM

## 2014-11-26 MED ORDER — MELOXICAM 15 MG PO TABS: 15.0000 mg | ORAL_TABLET | Freq: Every day | ORAL | Status: DC

## 2014-11-26 NOTE — Patient Instructions (Signed)
Take amlodipine and Meloxicam. Continue BP meds previously prescribed.

## 2014-12-13 ENCOUNTER — Other Ambulatory Visit: Payer: Self-pay | Admitting: *Deleted

## 2014-12-13 MED ORDER — RAMIPRIL 10 MG PO CAPS: 10.0000 mg | ORAL_CAPSULE | Freq: Every day | ORAL | Status: DC

## 2014-12-13 MED ORDER — AMLODIPINE BESYLATE 5 MG PO TABS: 5.0000 mg | ORAL_TABLET | Freq: Every day | ORAL | Status: DC

## 2014-12-13 NOTE — Telephone Encounter (Signed)
Patient states pharmacy said they had not received refills on his amlodipine or ramipril we sent refills on 11/26/14 confirmed with pateint correct pharmacy resent refills

## 2015-01-22 ENCOUNTER — Ambulatory Visit: Payer: BC Managed Care – PPO | Admitting: Internal Medicine

## 2015-01-28 ENCOUNTER — Encounter: Payer: Self-pay | Admitting: Internal Medicine

## 2015-01-28 ENCOUNTER — Ambulatory Visit (INDEPENDENT_AMBULATORY_CARE_PROVIDER_SITE_OTHER): Payer: BLUE CROSS/BLUE SHIELD | Admitting: Internal Medicine

## 2015-01-28 VITALS — BP 140/90 | Wt 216.0 lb

## 2015-01-28 DIAGNOSIS — I10 Essential (primary) hypertension: Secondary | ICD-10-CM

## 2015-01-28 DIAGNOSIS — M25512 Pain in left shoulder: Secondary | ICD-10-CM

## 2015-01-29 ENCOUNTER — Encounter: Payer: Self-pay | Admitting: Internal Medicine

## 2015-01-29 NOTE — Progress Notes (Signed)
   Subjective:    Patient ID: Matthew PigeonRalph Edward Badley, male    DOB: 1961/09/04, 54 y.o.   MRN: [id number]  HPI  54 year old male in today for follow-up of hypertension. He is on Ramapril 10 mg daily. His father died and patient is been busy cleaning out father's house on weekends. He is not getting exercise and hike is much as he would like to. Blood pressure is elevated today. Still has some grief reaction from death of father.  Also having some left shoulder pain which is been aggravating him particularly at work. He works as a Curatormechanic.    Review of Systems     Objective:   Physical Exam  Skin warm and dry. Nodes none. Neck supple without JVD thyromegaly or carotid bruits. Chest clear to auscultation. Cardiac exam: Regular rate and rhythm without ectopy. Extremities without edema. Good range of motion in left shoulder without crepitus    Assessment & Plan:  Hypertension-elevated today. Needs to monitor at home.  Grief reaction secondary to loss father  Shoulder pain-likely musculoskeletal pain  Plan: Meloxicam 15 mg daily. Continue Ramipril 10 mg daily. Add amlodipine 5 mg daily. Return in February for follow-up. Try get more exercise and watch diet.

## 2015-01-29 NOTE — Progress Notes (Signed)
   Subjective:    Patient ID: Matthew PigeonRalph Edward Huff, male    DOB: May 24, 1961, 54 y.o.   MRN: [id number]  HPI He finally finished cleaning out his father's house. He went hiking last weekend. Blood pressure remains elevated despite being on amlodipine 5 mg daily and Ramapo real 10 mg daily. We discussed these medications, how they work, side effects of what options we could add if necessary. He really doesn't want to add any medication at this point in time. He would like to try to get back into a regular exercise regimen. He has gained some weight. Weight today is 216 pounds.  History of left shoulder pain treated with meloxicam.    Review of Systems     Objective:   Physical Exam  Neck is supple without JVD thyromegaly or carotid bruits. Chest clear to auscultation. Cardiac exam regular rate and rhythm normal S1 and S2. Extremities without edema      Assessment & Plan:  Essential hypertension  Plan: Patient will return in 4-6 weeks for recheck and reevaluation. Continue amlodipine and Ramapo at same dosages for now. Shoulder pain stable on meloxicam.

## 2015-01-29 NOTE — Patient Instructions (Signed)
Continue same medications and return in 4-6 weeks

## 2015-02-25 ENCOUNTER — Encounter: Payer: Self-pay | Admitting: Internal Medicine

## 2015-02-25 ENCOUNTER — Ambulatory Visit (INDEPENDENT_AMBULATORY_CARE_PROVIDER_SITE_OTHER): Payer: BLUE CROSS/BLUE SHIELD | Admitting: Internal Medicine

## 2015-02-25 VITALS — BP 140/80 | HR 91 | Resp 14 | Wt 216.0 lb

## 2015-02-25 DIAGNOSIS — W57XXXD Bitten or stung by nonvenomous insect and other nonvenomous arthropods, subsequent encounter: Secondary | ICD-10-CM

## 2015-02-25 DIAGNOSIS — S50861D Insect bite (nonvenomous) of right forearm, subsequent encounter: Secondary | ICD-10-CM

## 2015-02-25 DIAGNOSIS — I10 Essential (primary) hypertension: Secondary | ICD-10-CM | POA: Diagnosis not present

## 2015-02-25 NOTE — Progress Notes (Signed)
   Subjective:    Patient ID: Matthew Huff, male    DOB: [birth year], 54 y.o.   MRN: [id number]  HPI 54 year old Male in today for blood pressure check. Initially his blood pressure is elevated when he first got to the office but he was late for his appointment and apparently he been having a bad day at work. He doesn't really want to change blood pressure medications at this point in time. I rechecked his blood pressure was 140/80. He was to get back into hiking. Hasn't had much time for exercise. Still grieving loss of father. He's been walking some on the weekends which helps his weight and stress. He attends church. He would like to be a committed relationship but hasn't really met anybody that he felt he was compatible with.  This past weekend he was visiting his daughter in [place] and helping her do some yard work. An insect bit him on the right forearm volar aspect. He went to a [facility] and was prescribed Keflex which he did not take. Says the erythema has decreased considerably.    Review of Systems     Objective:   Physical Exam  Spent 20 minutes speaking with patient about all of these issues. He doesn't want to change antihypertensive medication at this point in time. Discussed options including changing Ramapril to losartan HCTZ and continuing amlodipine 5 mg daily. I'm reluctant to increase amlodipine to 10 mg daily as it may cause some edema. Has insect bite right forearm mid volar aspect which is improving. No significant erythema or swelling     Assessment & Plan:  Essential hypertension  Insect bite right forearm-improving  Grief reaction with  loss of father  Plan: He'll return in 3 months for physical exam and fasting lab work. Continue same medications.

## 2015-02-25 NOTE — Patient Instructions (Signed)
Continue same medications and return in 3 months office visit and physical examination with fasting lab work

## 2015-03-10 ENCOUNTER — Ambulatory Visit (INDEPENDENT_AMBULATORY_CARE_PROVIDER_SITE_OTHER): Payer: BLUE CROSS/BLUE SHIELD | Admitting: Internal Medicine

## 2015-03-10 ENCOUNTER — Encounter: Payer: Self-pay | Admitting: Internal Medicine

## 2015-03-10 VITALS — BP 140/90 | HR 88 | Temp 97.4°F | Wt 214.0 lb

## 2015-03-10 DIAGNOSIS — M12819 Other specific arthropathies, not elsewhere classified, unspecified shoulder: Secondary | ICD-10-CM

## 2015-03-10 DIAGNOSIS — M19012 Primary osteoarthritis, left shoulder: Secondary | ICD-10-CM

## 2015-03-10 DIAGNOSIS — I10 Essential (primary) hypertension: Secondary | ICD-10-CM

## 2015-03-10 NOTE — Progress Notes (Signed)
   Subjective:    Patient ID: Matthew PigeonRalph Edward Huff, male    DOB: 07-Apr-1961, 54 y.o.   MRN: [id number]  HPI Patient in today with left shoulder pain. He is on Mobic 15 mg daily. 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 Felt pain in his left deltoid muscle area. Had immediate pain and decreased range of motion in the left upper extremity. Could not get left arm up over his head.     Review of Systems     Objective:   Physical Exam  Blood pressure is elevated secondary to pain. Popping in the humeral joint with range of motion exercises. Decreased range of motion. Cannot get left arm up over his head without extreme pain.      Assessment & Plan:   Left shoulder arthropathy  Essential hypertension  Plan: Appointment with orthopedist for further evaluation. Continue Mobic 15 mg daily.

## 2015-03-10 NOTE — Patient Instructions (Signed)
Shoulder injected with Depo-Medrol Marcaine and Xylocaine. Continue range of motion exercises. Call if not better in 48 hours or sooner if worse.

## 2015-03-10 NOTE — Addendum Note (Signed)
Addended by: Margaree MackintoshBAXLEY, MARY J on: 03/10/2015 10:42 AM   Modules accepted: Level of Service

## 2015-04-29 ENCOUNTER — Encounter: Payer: Self-pay | Admitting: Internal Medicine

## 2015-04-29 ENCOUNTER — Ambulatory Visit (INDEPENDENT_AMBULATORY_CARE_PROVIDER_SITE_OTHER): Payer: BLUE CROSS/BLUE SHIELD | Admitting: Internal Medicine

## 2015-04-29 VITALS — BP 138/88 | HR 83 | Temp 98.3°F | Ht 71.0 in | Wt 214.0 lb

## 2015-04-29 DIAGNOSIS — M12819 Other specific arthropathies, not elsewhere classified, unspecified shoulder: Secondary | ICD-10-CM | POA: Diagnosis not present

## 2015-04-29 DIAGNOSIS — I10 Essential (primary) hypertension: Secondary | ICD-10-CM | POA: Diagnosis not present

## 2015-04-29 DIAGNOSIS — M19012 Primary osteoarthritis, left shoulder: Secondary | ICD-10-CM

## 2015-04-29 DIAGNOSIS — H00013 Hordeolum externum right eye, unspecified eyelid: Secondary | ICD-10-CM | POA: Diagnosis not present

## 2015-04-29 MED ORDER — BACITRACIN-NEOMYCIN-POLYMYXIN 400-5-5000 EX OINT: 1.0000 "application " | TOPICAL_OINTMENT | Freq: Two times a day (BID) | CUTANEOUS | Status: DC

## 2015-04-29 MED ORDER — BACITRACIN-POLYMYXIN B 500-10000 UNIT/GM OP OINT: 1.0000 "application " | TOPICAL_OINTMENT | Freq: Three times a day (TID) | OPHTHALMIC | Status: DC

## 2015-04-29 NOTE — Patient Instructions (Signed)
Warm hot compresses to right eye 2-3 times a day for 20 minutes. Use ointment in right eye 3 times a day.

## 2015-04-29 NOTE — Progress Notes (Signed)
   Subjective:    Patient ID: Elisabeth PigeonRalph Edward Hunt, male    DOB: 1961/09/19, 54 y.o.   MRN: [id number]  HPI  Noticed  development of stye over the weekend of right eye. Hss been applying some hot washcloths to the area but not for 20 minutes at a time. No drainage from eye. No visual disturbances. Never had this before.  Situation with left shoulder is much better haven't seen orthopedist. Shoulder was injected any was sent physical therapy.  Review of Systems     Objective:   Physical Exam  Has hordeolum right lower eyelid with erythema of conjunctiva inside right lower lid. Hordeolum right eye      Assessment & Plan:  Hordeolum right eye-see below  Conjunctivitis  Left shoulder arthropathy- improved  Essential hypertension-stable  Plan: Polysporin ophthalmic ointment to use and right eye 3 times a day. Warm hot compresses to right eye 2-3 times daily for 20 minutes. Call if not better in 48 hours.

## 2015-05-20 ENCOUNTER — Other Ambulatory Visit: Payer: BLUE CROSS/BLUE SHIELD | Admitting: Internal Medicine

## 2015-05-20 DIAGNOSIS — Z0000 Encounter for general adult medical examination without abnormal findings: Secondary | ICD-10-CM

## 2015-05-20 DIAGNOSIS — Z125 Encounter for screening for malignant neoplasm of prostate: Secondary | ICD-10-CM

## 2015-05-20 DIAGNOSIS — Z13220 Encounter for screening for lipoid disorders: Secondary | ICD-10-CM

## 2015-05-20 LAB — COMPLETE METABOLIC PANEL WITH GFR
ALBUMIN: 5.5 g/dL — AB (ref 3.5–5.2)
ALT: 18 U/L (ref 0–53)
AST: 19 U/L (ref 0–37)
Alkaline Phosphatase: 44 U/L (ref 39–117)
BUN: 9 mg/dL (ref 6–23)
CHLORIDE: 100 meq/L (ref 96–112)
CO2: 27 meq/L (ref 19–32)
Calcium: 9.7 mg/dL (ref 8.4–10.5)
Creat: 0.72 mg/dL (ref 0.50–1.35)
GFR, Est African American: >89 mL/min
GFR, Est Non African American: >89 mL/min
Glucose, Bld: 90 mg/dL (ref 70–99)
POTASSIUM: 4.1 meq/L (ref 3.5–5.3)
SODIUM: 140 meq/L (ref 135–145)
TOTAL PROTEIN: 7.3 g/dL (ref 6.0–8.3)
Total Bilirubin: 0.8 mg/dL (ref 0.2–1.2)

## 2015-05-20 LAB — CBC WITH DIFFERENTIAL/PLATELET
Basophils Absolute: 0.1 10*3/uL (ref 0.0–0.1)
Basophils Relative: 1 % (ref 0–1)
Eosinophils Absolute: 0.1 10*3/uL (ref 0.0–0.7)
Eosinophils Relative: 1 % (ref 0–5)
HEMATOCRIT: 46.1 % (ref 39.0–52.0)
Hemoglobin: 15.9 g/dL (ref 13.0–17.0)
LYMPHS ABS: 1.8 10*3/uL (ref 0.7–4.0)
LYMPHS PCT: 23 % (ref 12–46)
MCH: 32.9 pg (ref 26.0–34.0)
MCHC: 34.5 g/dL (ref 30.0–36.0)
MCV: 95.2 fL (ref 78.0–100.0)
MONO ABS: 0.6 10*3/uL (ref 0.1–1.0)
MONOS PCT: 8 % (ref 3–12)
MPV: 9.8 fL (ref 8.6–12.4)
NEUTROS ABS: 5.3 10*3/uL (ref 1.7–7.7)
Neutrophils Relative %: 67 % (ref 43–77)
Platelets: 205 10*3/uL (ref 150–400)
RBC: 4.84 MIL/uL (ref 4.22–5.81)
RDW: 13.1 % (ref 11.5–15.5)
WBC: 7.9 10*3/uL (ref 4.0–10.5)

## 2015-05-20 LAB — LIPID PANEL
CHOL/HDL RATIO: 1.6 ratio
CHOLESTEROL: 214 mg/dL — AB (ref 0–200)
HDL: 131 mg/dL (ref >=40)
LDL CALC: 71 mg/dL (ref 0–99)
Triglycerides: 61 mg/dL (ref <150)
VLDL: 12 mg/dL (ref 0–40)

## 2015-05-21 LAB — PSA: PSA: 1.97 ng/mL (ref <=4.00)

## 2015-05-27 ENCOUNTER — Ambulatory Visit (INDEPENDENT_AMBULATORY_CARE_PROVIDER_SITE_OTHER): Payer: BLUE CROSS/BLUE SHIELD | Admitting: Internal Medicine

## 2015-05-27 ENCOUNTER — Encounter: Payer: Self-pay | Admitting: Internal Medicine

## 2015-05-27 VITALS — BP 140/80 | HR 87 | Temp 98.3°F | Ht 71.0 in | Wt 214.0 lb

## 2015-05-27 DIAGNOSIS — Z0000 Encounter for general adult medical examination without abnormal findings: Secondary | ICD-10-CM | POA: Diagnosis not present

## 2015-05-27 DIAGNOSIS — M129 Arthropathy, unspecified: Secondary | ICD-10-CM | POA: Diagnosis not present

## 2015-05-27 DIAGNOSIS — M19019 Primary osteoarthritis, unspecified shoulder: Secondary | ICD-10-CM

## 2015-05-27 DIAGNOSIS — I10 Essential (primary) hypertension: Secondary | ICD-10-CM | POA: Diagnosis not present

## 2015-05-27 LAB — POCT URINALYSIS DIPSTICK
BILIRUBIN UA: NEGATIVE
Glucose, UA: NEGATIVE
Ketones, UA: NEGATIVE
Leukocytes, UA: NEGATIVE
Nitrite, UA: NEGATIVE
Protein, UA: NEGATIVE
RBC UA: NEGATIVE
Urobilinogen, UA: NEGATIVE
pH, UA: 7.5

## 2015-05-27 NOTE — Progress Notes (Signed)
Subjective:    Patient ID: Matthew Huff, male    DOB: 1961-09-25, 54 y.o.   MRN: [id number]  HPI 54 year old White Male with history of hypertension in today for health maintenance exam and evaluation of medical issues. Blood pressure has been a bit labile lately. He is back to hiking on a regular basis. Enjoying that. Shoulder pain has improved after physical therapy and seeing orthopedist. Patient says blood pressure runs very well at home. Tends to be up a bit in the office. Probably has a bit of office hypertension. Weight is up 5 pounds from January 2015. He would like to be at least 5 pounds lighter.  Past medical history: Fractured right arm at 54 years of age. Fractured right ankle 54 years of age. He used to be on Lipitor for hyperlipidemia but once he lost weight by diet and exercise, he was able to discontinue Lipitor. Left inguinal hernia repair March 2012.  Social history: He is divorced. Works as a Curator at [employer]. One daughter who is a Engineer, civil (consulting) and lives in the [place] area. She is married. No grandchildren. Patient does not smoke. Social alcohol consumption.  Family history: Mother died of end-stage congestive heart failure. Father with history of dementia died with complications of hypertensive artery sclerotic cardiovascular disease. One sister who is overweight. His father just expired this past year and he spent considerable time cleaning out father's house .      Review of Systems  Constitutional: Negative.   HENT: Negative.   Eyes: Negative.   Respiratory: Negative.   Cardiovascular: Negative.   Gastrointestinal: Negative.   Endocrine: Negative.   Genitourinary: Negative.   Musculoskeletal:       Left shoulder arthropathy improved after injection and physical therapy exercises which he continues to do  Skin: Negative.   Allergic/Immunologic: Negative.   Neurological: Negative.   Hematological: Negative.   Psychiatric/Behavioral:  Negative.        Objective:   Physical Exam  Constitutional: He is oriented to person, place, and time. He appears well-developed and well-nourished. No distress.  HENT:  Head: Normocephalic and atraumatic.  Right Ear: External ear normal.  Left Ear: External ear normal.  Mouth/Throat: Oropharynx is clear and moist. No oropharyngeal exudate.  Eyes: Conjunctivae and EOM are normal. Pupils are equal, round, and reactive to light. Right eye exhibits no discharge. Left eye exhibits no discharge. No scleral icterus.  Neck: Neck supple. No JVD present. No thyromegaly present.  Cardiovascular: Normal rate, regular rhythm, normal heart sounds and intact distal pulses.   No murmur heard. Pulmonary/Chest: Effort normal and breath sounds normal. No respiratory distress. He has no wheezes. He has no rales. He exhibits no tenderness.  Abdominal: Soft. Bowel sounds are normal. He exhibits no distension and no mass. There is no tenderness. There is no rebound and no guarding.  Genitourinary: Prostate normal.  Musculoskeletal: Normal range of motion. He exhibits no edema.  Lymphadenopathy:    He has no cervical adenopathy.  Neurological: He is alert and oriented to person, place, and time. He has normal reflexes. No cranial nerve deficit. Coordination normal.  Skin: Skin is warm and dry. No rash noted. He is not diaphoretic.  Psychiatric: He has a normal mood and affect. His behavior is normal. Judgment and thought content normal.  Vitals reviewed.         Assessment & Plan:  Essential hypertension-he will continue to monitor at home. Apparently it has an element of labile hypertension possibly  related to office hypertension. He says it's much better at home. Continue same medication for nail. Try to lose 5 pounds.  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 He continues to do his exercises for an hour daily.  Plan: Needs screening colonoscopy. Return in 6 months for office visit  and blood pressure check. Tetanus immunization is up-to-date.

## 2015-05-27 NOTE — Patient Instructions (Addendum)
It was a pleasure to see you today. Please continue same medications and return in 6 months for office visit blood pressure check

## 2015-10-24 ENCOUNTER — Other Ambulatory Visit: Payer: Self-pay | Admitting: Internal Medicine

## 2015-11-24 ENCOUNTER — Ambulatory Visit: Payer: BLUE CROSS/BLUE SHIELD | Admitting: Internal Medicine

## 2016-01-03 ENCOUNTER — Other Ambulatory Visit: Payer: Self-pay | Admitting: Internal Medicine

## 2016-02-17 ENCOUNTER — Other Ambulatory Visit: Payer: Self-pay | Admitting: Internal Medicine

## 2016-05-08 ENCOUNTER — Other Ambulatory Visit: Payer: Self-pay | Admitting: Internal Medicine

## 2016-05-09 NOTE — Telephone Encounter (Signed)
Needs appt

## 2016-05-11 ENCOUNTER — Other Ambulatory Visit: Payer: Self-pay

## 2016-05-11 MED ORDER — RAMIPRIL 10 MG PO CAPS: 10.0000 mg | ORAL_CAPSULE | Freq: Every day | ORAL | Status: DC

## 2016-05-16 ENCOUNTER — Other Ambulatory Visit: Payer: Self-pay | Admitting: Internal Medicine

## 2016-06-03 ENCOUNTER — Other Ambulatory Visit: Payer: BLUE CROSS/BLUE SHIELD | Admitting: Internal Medicine

## 2016-06-03 DIAGNOSIS — I10 Essential (primary) hypertension: Secondary | ICD-10-CM | POA: Diagnosis not present

## 2016-06-03 DIAGNOSIS — Z13220 Encounter for screening for lipoid disorders: Secondary | ICD-10-CM | POA: Diagnosis not present

## 2016-06-03 DIAGNOSIS — Z0000 Encounter for general adult medical examination without abnormal findings: Secondary | ICD-10-CM

## 2016-06-03 DIAGNOSIS — Z125 Encounter for screening for malignant neoplasm of prostate: Secondary | ICD-10-CM | POA: Diagnosis not present

## 2016-06-03 LAB — CBC WITH DIFFERENTIAL/PLATELET
BASOS ABS: 0 {cells}/uL (ref 0–200)
Basophils Relative: 0 %
EOS ABS: 69 {cells}/uL (ref 15–500)
Eosinophils Relative: 1 %
HCT: 46.9 % (ref 38.5–50.0)
HEMOGLOBIN: 16.1 g/dL (ref 13.2–17.1)
LYMPHS ABS: 1587 {cells}/uL (ref 850–3900)
Lymphocytes Relative: 23 %
MCH: 33.1 pg — AB (ref 27.0–33.0)
MCHC: 34.3 g/dL (ref 32.0–36.0)
MCV: 96.3 fL (ref 80.0–100.0)
MPV: 9.8 fL (ref 7.5–12.5)
Monocytes Absolute: 621 cells/uL (ref 200–950)
Monocytes Relative: 9 %
NEUTROS ABS: 4623 {cells}/uL (ref 1500–7800)
Neutrophils Relative %: 67 %
Platelets: 190 10*3/uL (ref 140–400)
RBC: 4.87 MIL/uL (ref 4.20–5.80)
RDW: 13 % (ref 11.0–15.0)
WBC: 6.9 10*3/uL (ref 3.8–10.8)

## 2016-06-03 LAB — COMPLETE METABOLIC PANEL WITH GFR
ALBUMIN: 4.7 g/dL (ref 3.6–5.1)
ALK PHOS: 42 U/L (ref 40–115)
ALT: 37 U/L (ref 9–46)
AST: 32 U/L (ref 10–35)
BILIRUBIN TOTAL: 0.8 mg/dL (ref 0.2–1.2)
BUN: 10 mg/dL (ref 7–25)
CO2: 25 mmol/L (ref 20–31)
CREATININE: 0.72 mg/dL (ref 0.70–1.33)
Calcium: 9.6 mg/dL (ref 8.6–10.3)
Chloride: 100 mmol/L (ref 98–110)
GLUCOSE: 80 mg/dL (ref 65–99)
Potassium: 4 mmol/L (ref 3.5–5.3)
SODIUM: 140 mmol/L (ref 135–146)
TOTAL PROTEIN: 7 g/dL (ref 6.1–8.1)

## 2016-06-03 LAB — LIPID PANEL
Cholesterol: 204 mg/dL — ABNORMAL HIGH (ref 125–200)
HDL: 134 mg/dL (ref >=40)
LDL Cholesterol: 61 mg/dL (ref <130)
Total CHOL/HDL Ratio: 1.5 Ratio (ref <=5.0)
Triglycerides: 47 mg/dL (ref <150)
VLDL: 9 mg/dL (ref <30)

## 2016-06-04 ENCOUNTER — Ambulatory Visit (INDEPENDENT_AMBULATORY_CARE_PROVIDER_SITE_OTHER): Payer: BLUE CROSS/BLUE SHIELD | Admitting: Internal Medicine

## 2016-06-04 ENCOUNTER — Encounter: Payer: Self-pay | Admitting: Internal Medicine

## 2016-06-04 VITALS — BP 140/90 | HR 90 | Temp 98.7°F | Resp 18 | Ht 71.0 in | Wt 224.0 lb

## 2016-06-04 DIAGNOSIS — I10 Essential (primary) hypertension: Secondary | ICD-10-CM | POA: Diagnosis not present

## 2016-06-04 DIAGNOSIS — R0989 Other specified symptoms and signs involving the circulatory and respiratory systems: Secondary | ICD-10-CM | POA: Diagnosis not present

## 2016-06-04 DIAGNOSIS — M7918 Myalgia, other site: Secondary | ICD-10-CM

## 2016-06-04 DIAGNOSIS — M791 Myalgia: Secondary | ICD-10-CM | POA: Diagnosis not present

## 2016-06-04 DIAGNOSIS — Z0000 Encounter for general adult medical examination without abnormal findings: Secondary | ICD-10-CM | POA: Diagnosis not present

## 2016-06-04 DIAGNOSIS — E7889 Other lipoprotein metabolism disorders: Secondary | ICD-10-CM

## 2016-06-04 LAB — POCT URINALYSIS DIPSTICK
BILIRUBIN UA: NEGATIVE
Blood, UA: NEGATIVE
GLUCOSE UA: NEGATIVE
KETONES UA: NEGATIVE
Leukocytes, UA: NEGATIVE
Nitrite, UA: NEGATIVE
Protein, UA: NEGATIVE
SPEC GRAV UA: 1.01
Urobilinogen, UA: 0.2
pH, UA: 8

## 2016-06-04 LAB — PSA: PSA: 1.68 ng/mL (ref <=4.00)

## 2016-06-04 NOTE — Progress Notes (Signed)
Subjective:    Patient ID: Elisabeth PigeonRalph Edward Peak, male    DOB: 02/05/1961, 55 y.o.   MRN: [id number]  HPI 55 year old White Male in today for health maintenance exam and evaluation of medical issues. History of hypertension. Blood pressures elevated in the office today but did improve after initial blood pressure assessment. Last reading was 140/90 before he left the office. He agrees to keep record of blood pressure at at home and call me within a week or so with some readings. Says he feels he may have an element of office hypertension.  History of shoulder arthropathy which has improved with meloxicam. Weight in June 2016 was 214 pounds and is now 224 pounds. Has not been doing as much hiking recently. Riding bicycle a lot. Usually has one alcoholic drink at night.  Social history: He is divorced. 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 One daughter who is a Engineer, civil (consulting)nurse and lives in the [place]. She is married. No grandchildren. Patient does not smoke. Social alcohol consumption.  Family history: Mother died of end-stage congestive heart failure. Father with history of dementia died with complications of hypertensive arteriosclerotic cardiovascular disease. One sister who is overweight. Father expired in 2016 and that was difficult for the patient. His dog has died within the past year as well. He resides alone.  Recently he was hiking and took a fall and had considerable bruising. He did not strike his head and did not have a concussion. Has recovered from this.  Past medical history: Fractured right arm and 55 years of age. Fractured right ankle 55 years of age. He used to be on Lipitor for hyperlipidemia but once he lost weight by diet and exercise he was able to discontinue that. Left inguinal hernia repair March 2012.       Review of Systems  Constitutional: Negative.   All other systems reviewed and are negative.  no new complaints      Objective:   Physical Exam    Constitutional: He is oriented to person, place, and time. He appears well-developed and well-nourished. No distress.  HENT:  Head: Normocephalic and atraumatic.  Right Ear: External ear normal.  Left Ear: External ear normal.  Mouth/Throat: Oropharynx is clear and moist.  Eyes: Conjunctivae are normal. Pupils are equal, round, and reactive to light. Right eye exhibits no discharge. Left eye exhibits no discharge.  Neck: Neck supple. No JVD present. No thyromegaly present.  Cardiovascular: Normal rate, regular rhythm, normal heart sounds and intact distal pulses.   No murmur heard. Pulmonary/Chest: Effort normal and breath sounds normal. No respiratory distress. He has no wheezes. He has no rales.  Abdominal: Soft. Bowel sounds are normal. He exhibits no distension. There is no tenderness. There is no rebound and no guarding.  Genitourinary: Prostate normal.  Musculoskeletal: He exhibits no edema.  Lymphadenopathy:    He has no cervical adenopathy.  Neurological: He is alert and oriented to person, place, and time. No cranial nerve deficit. Coordination normal.  Skin: Skin is warm and dry. No rash noted. He is not diaphoretic.  Psychiatric: He has a normal mood and affect. His behavior is normal. Judgment and thought content normal.  Vitals reviewed.         Assessment & Plan:  Essential hypertension-probably does have an element of labile/office hypertension but needs to call with blood pressure readings from home in the next week or 2. We may need to consider changing his antihypertensive medication if persistently elevated  Obesity-will work on [employer] loss  Lab work reviewed and is entirely within normal limits.  History of left shoulder arthropathy-improved after injection and physical therapy exercises in 2016

## 2016-06-04 NOTE — Patient Instructions (Signed)
It was a pleasure to see you today. I am pleased with your lab work especially high HDL cholesterol. Continue diet and exercise efforts and try to lose some weight. Call with blood pressure readings in 1-2 weeks to see if a change is warranted in her antihypertensive medication regimen. Consider screening colonoscopy. Please notify us when you're ready to do this. Otherwise return in 6 months.

## 2016-06-09 ENCOUNTER — Other Ambulatory Visit: Payer: Self-pay | Admitting: Internal Medicine

## 2016-06-09 NOTE — Telephone Encounter (Signed)
Verbal order by Dr. Lenord FellersBaxley; ok to refill Meloxicam 15mg  #90, 1 refill.  Left message on voicemail at [facility] @ [phone].

## 2016-08-02 ENCOUNTER — Other Ambulatory Visit: Payer: Self-pay | Admitting: Internal Medicine

## 2016-08-07 ENCOUNTER — Other Ambulatory Visit: Payer: Self-pay | Admitting: Internal Medicine

## 2016-11-04 ENCOUNTER — Other Ambulatory Visit: Payer: Self-pay | Admitting: Internal Medicine

## 2016-11-04 NOTE — Telephone Encounter (Signed)
Appointment made, ramipril refilled.

## 2016-11-04 NOTE — Telephone Encounter (Signed)
Last seen in late June. Needs six-month recheck appointment. Please make appointment before refilling. 

## 2016-11-04 NOTE — Telephone Encounter (Signed)
Last seen in late June. Needs six-month recheck appointment. Please make appointment before refilling.

## 2016-11-04 NOTE — Telephone Encounter (Signed)
[facility] refilled appointment 11/26/16 at 945.

## 2016-11-09 ENCOUNTER — Ambulatory Visit (INDEPENDENT_AMBULATORY_CARE_PROVIDER_SITE_OTHER): Payer: BLUE CROSS/BLUE SHIELD | Admitting: Internal Medicine

## 2016-11-09 DIAGNOSIS — Z23 Encounter for immunization: Secondary | ICD-10-CM

## 2016-11-26 ENCOUNTER — Encounter: Payer: Self-pay | Admitting: Internal Medicine

## 2016-11-26 ENCOUNTER — Ambulatory Visit (INDEPENDENT_AMBULATORY_CARE_PROVIDER_SITE_OTHER): Payer: BLUE CROSS/BLUE SHIELD | Admitting: Internal Medicine

## 2016-11-26 VITALS — BP 132/86 | Wt 227.0 lb

## 2016-11-26 DIAGNOSIS — M791 Myalgia: Secondary | ICD-10-CM

## 2016-11-26 DIAGNOSIS — I10 Essential (primary) hypertension: Secondary | ICD-10-CM | POA: Diagnosis not present

## 2016-11-26 DIAGNOSIS — R202 Paresthesia of skin: Secondary | ICD-10-CM | POA: Diagnosis not present

## 2016-11-26 DIAGNOSIS — M7918 Myalgia, other site: Secondary | ICD-10-CM

## 2016-11-26 NOTE — Patient Instructions (Signed)
Monitor blood pressure at home and call if persistently elevated. Recommend diet exercise and weight loss. Continue same medications.

## 2016-11-26 NOTE — Progress Notes (Signed)
   Subjective:    Patient ID: Matthew PigeonRalph Edward Huff, male    DOB: [birth year], 55 y.o.   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 He was here June 2017 for physical examination. Blood pressure is stable and under good control on current regimen. Unfortunately he has gained some weight. Not hiking as much as he once did. In June he weighed 224 pounds. Now weighs 227 pounds. In 2015, he weighed 206 pounds.  Has noticed some numbness and tingling in feet during the day wearing her tennis shoes he's had for some time. However, when he removes his shoes the symptoms go away. He has no prior history of diabetes. Fasting glucose was normal in June. I think he probably needs to be fitted for  Shoes and see if symptoms resolve. He does have a history of plantar fasciitis in his heel. He does take Mobic daily for musculoskeletal pain. Has not been watching blood pressure at home. Order written for home blood pressure monitor.  Review of Systems see above     Objective:   Physical Exam Skin warm and dry. Nodes none. Neck is supple without JVD thyromegaly or carotid bruits. Chest clear to auscultation. Cardiac exam regular rate and rhythm normal S1 and S2. Extremities without edema. Sensation present in plantar aspects of feet bilaterally with light touch.       Assessment & Plan:  Essential hypertension  Musculoskeletal pain  History of plantar fasciitis  Intermittent paresthesias in feet-may be related to shoes being too tight  Plan: Return in July for physical examination and monitor blood pressure at home. Call if blood pressure is elevated. Diet exercise and weight loss recommended.

## 2016-12-02 ENCOUNTER — Other Ambulatory Visit: Payer: Self-pay | Admitting: Internal Medicine

## 2016-12-02 ENCOUNTER — Encounter: Payer: Self-pay | Admitting: Internal Medicine

## 2016-12-02 NOTE — Patient Instructions (Signed)
Flu vaccine given.

## 2016-12-02 NOTE — Progress Notes (Signed)
Flu vaccine given.

## 2017-01-06 DIAGNOSIS — M25562 Pain in left knee: Secondary | ICD-10-CM | POA: Diagnosis not present

## 2017-02-25 ENCOUNTER — Other Ambulatory Visit: Payer: Self-pay | Admitting: Internal Medicine

## 2017-05-30 ENCOUNTER — Other Ambulatory Visit: Payer: Self-pay | Admitting: Internal Medicine

## 2017-06-06 ENCOUNTER — Other Ambulatory Visit: Payer: BLUE CROSS/BLUE SHIELD | Admitting: Internal Medicine

## 2017-06-07 ENCOUNTER — Encounter: Payer: BLUE CROSS/BLUE SHIELD | Admitting: Internal Medicine

## 2017-06-13 ENCOUNTER — Other Ambulatory Visit: Payer: BLUE CROSS/BLUE SHIELD | Admitting: Internal Medicine

## 2017-06-13 DIAGNOSIS — Z125 Encounter for screening for malignant neoplasm of prostate: Secondary | ICD-10-CM

## 2017-06-13 DIAGNOSIS — Z0000 Encounter for general adult medical examination without abnormal findings: Secondary | ICD-10-CM

## 2017-06-13 DIAGNOSIS — Z13220 Encounter for screening for lipoid disorders: Secondary | ICD-10-CM

## 2017-06-13 DIAGNOSIS — Z130 Encounter for screening for diseases of the blood and blood-forming organs and certain disorders involving the immune mechanism: Secondary | ICD-10-CM

## 2017-06-13 DIAGNOSIS — I10 Essential (primary) hypertension: Secondary | ICD-10-CM | POA: Diagnosis not present

## 2017-06-14 ENCOUNTER — Ambulatory Visit (INDEPENDENT_AMBULATORY_CARE_PROVIDER_SITE_OTHER): Payer: BLUE CROSS/BLUE SHIELD | Admitting: Internal Medicine

## 2017-06-14 ENCOUNTER — Encounter: Payer: Self-pay | Admitting: Internal Medicine

## 2017-06-14 VITALS — BP 148/98 | HR 77 | Temp 97.9°F | Ht 70.5 in | Wt 227.0 lb

## 2017-06-14 DIAGNOSIS — M791 Myalgia: Secondary | ICD-10-CM | POA: Diagnosis not present

## 2017-06-14 DIAGNOSIS — R7989 Other specified abnormal findings of blood chemistry: Secondary | ICD-10-CM

## 2017-06-14 DIAGNOSIS — M19011 Primary osteoarthritis, right shoulder: Secondary | ICD-10-CM | POA: Diagnosis not present

## 2017-06-14 DIAGNOSIS — Z0000 Encounter for general adult medical examination without abnormal findings: Secondary | ICD-10-CM

## 2017-06-14 DIAGNOSIS — M25562 Pain in left knee: Secondary | ICD-10-CM | POA: Diagnosis not present

## 2017-06-14 DIAGNOSIS — I10 Essential (primary) hypertension: Secondary | ICD-10-CM | POA: Diagnosis not present

## 2017-06-14 DIAGNOSIS — G8929 Other chronic pain: Secondary | ICD-10-CM

## 2017-06-14 DIAGNOSIS — M7918 Myalgia, other site: Secondary | ICD-10-CM

## 2017-06-14 LAB — CBC WITH DIFFERENTIAL/PLATELET
BASOS PCT: 0 %
Basophils Absolute: 0 cells/uL (ref 0–200)
EOS ABS: 86 {cells}/uL (ref 15–500)
Eosinophils Relative: 1 %
HCT: 50.3 % — ABNORMAL HIGH (ref 38.5–50.0)
Hemoglobin: 16.8 g/dL (ref 13.2–17.1)
Lymphocytes Relative: 18 %
Lymphs Abs: 1548 cells/uL (ref 850–3900)
MCH: 32.9 pg (ref 27.0–33.0)
MCHC: 33.4 g/dL (ref 32.0–36.0)
MCV: 98.6 fL (ref 80.0–100.0)
MONOS PCT: 9 %
MPV: 9.7 fL (ref 7.5–12.5)
Monocytes Absolute: 774 cells/uL (ref 200–950)
Neutro Abs: 6192 cells/uL (ref 1500–7800)
Neutrophils Relative %: 72 %
PLATELETS: 198 10*3/uL (ref 140–400)
RBC: 5.1 MIL/uL (ref 4.20–5.80)
RDW: 13.6 % (ref 11.0–15.0)
WBC: 8.6 10*3/uL (ref 3.8–10.8)

## 2017-06-14 LAB — POCT URINALYSIS DIPSTICK
BILIRUBIN UA: NEGATIVE
Glucose, UA: NEGATIVE
Ketones, UA: NEGATIVE
Leukocytes, UA: NEGATIVE
NITRITE UA: NEGATIVE
PH UA: 7 (ref 5.0–8.0)
Protein, UA: NEGATIVE
RBC UA: NEGATIVE
SPEC GRAV UA: 1.01 (ref 1.010–1.025)
UROBILINOGEN UA: 0.2 U/dL

## 2017-06-14 LAB — COMPLETE METABOLIC PANEL WITH GFR
ALT: 20 U/L (ref 9–46)
AST: 22 U/L (ref 10–35)
Albumin: 5.1 g/dL (ref 3.6–5.1)
Alkaline Phosphatase: 43 U/L (ref 40–115)
BILIRUBIN TOTAL: 0.6 mg/dL (ref 0.2–1.2)
BUN: 9 mg/dL (ref 7–25)
CHLORIDE: 100 mmol/L (ref 98–110)
CO2: 25 mmol/L (ref 20–31)
Calcium: 9.7 mg/dL (ref 8.6–10.3)
Creat: 0.76 mg/dL (ref 0.70–1.33)
GFR, Est African American: >89 mL/min (ref >=60)
GFR, Est Non African American: >89 mL/min (ref >=60)
GLUCOSE: 89 mg/dL (ref 65–99)
Potassium: 4 mmol/L (ref 3.5–5.3)
Sodium: 139 mmol/L (ref 135–146)
TOTAL PROTEIN: 7.4 g/dL (ref 6.1–8.1)

## 2017-06-14 LAB — LIPID PANEL
Cholesterol: 225 mg/dL — ABNORMAL HIGH (ref <200)
HDL: 136 mg/dL (ref >40)
LDL CALC: 79 mg/dL (ref <100)
Total CHOL/HDL Ratio: 1.7 Ratio (ref <5.0)
Triglycerides: 50 mg/dL (ref <150)
VLDL: 10 mg/dL (ref <30)

## 2017-06-14 LAB — PSA: PSA: 1.8 ng/mL (ref <=4.0)

## 2017-07-02 NOTE — Patient Instructions (Signed)
Continue meloxicam. Continue same antihypertensive medications and return in 4 weeks. Very well may need adjustment in blood pressure medications at that time. Continue to diet and exercise and lose weight.

## 2017-07-02 NOTE — Progress Notes (Signed)
   Subjective:    Patient ID: Matthew Huff, male    DOB: 1961-12-04, 56 y.o.   MRN: [id number]  HPI 56 year old White Male in today for health maintenance exam and evaluation of medical issues. He has a history of hypertension. Currently treated with [place] real 10 mg daily and amlodipine 5 mg daily.  Takes Mobic 15 mg daily for musculoskeletal pain.      Review of Systems     Objective:   Physical Exam  Constitutional: He is oriented to person, place, and time. He appears well-developed and well-nourished. No distress.  HENT:  Head: Normocephalic and atraumatic.  Right Ear: External ear normal.  Mouth/Throat: Oropharynx is clear and moist.  Eyes: Pupils are equal, round, and reactive to light. Conjunctivae and EOM are normal. Right eye exhibits no discharge. Left eye exhibits no discharge. No scleral icterus.  Neck: Neck supple. No JVD present.  Cardiovascular: Normal rate, regular rhythm and normal heart sounds.   No murmur heard. Pulmonary/Chest: Effort normal and breath sounds normal. No respiratory distress. He has no wheezes. He has no rales.  Abdominal: He exhibits no distension and no mass. There is no tenderness. There is no rebound.  Genitourinary: Prostate normal.  Musculoskeletal: Normal range of motion. He exhibits no edema.  Lymphadenopathy:    He has no cervical adenopathy.  Neurological: He is alert and oriented to person, place, and time. He has normal reflexes. No cranial nerve deficit. Coordination normal.  Skin: Skin is warm and dry. No rash noted. He is not diaphoretic.  Psychiatric: He has a normal mood and affect. His behavior is normal. Judgment and thought content normal.  Vitals reviewed.  Skin warm and dry. Nodes none.       Assessment & Plan:  Essential hypertension-blood pressure is elevated today at 148 overnight Nieto. I'm not pleased with this and he needs to return in 4 weeks for reevaluation.  Musculoskeletal pain-history of left  shoulder arthropathy. Has been injected by Delbert HarnessMurphy Wainer in 2016 .  Left knee pain-seen by Dr. Charlett BlakeVoytek in February 2018. Report indicates he likely has probable meniscal tear. He says this occurred at work when he slipped.  Weight in 2017 was 224 pounds and is now 227 pounds. BMI 32.11    Plan: Patient will return in 4 weeks for reassessment of blood pressure. I think he may need an adjustment in his medications but we will see. He needs to diet and exercise.

## 2017-07-12 ENCOUNTER — Ambulatory Visit (INDEPENDENT_AMBULATORY_CARE_PROVIDER_SITE_OTHER): Payer: BLUE CROSS/BLUE SHIELD | Admitting: Internal Medicine

## 2017-07-12 ENCOUNTER — Encounter: Payer: Self-pay | Admitting: Internal Medicine

## 2017-07-12 ENCOUNTER — Ambulatory Visit: Payer: BLUE CROSS/BLUE SHIELD | Admitting: Internal Medicine

## 2017-07-12 VITALS — BP 150/90 | HR 86 | Temp 98.1°F | Wt 228.0 lb

## 2017-07-12 DIAGNOSIS — M545 Low back pain, unspecified: Secondary | ICD-10-CM

## 2017-07-12 DIAGNOSIS — I10 Essential (primary) hypertension: Secondary | ICD-10-CM | POA: Diagnosis not present

## 2017-07-12 MED ORDER — PREDNISONE 10 MG PO TABS: ORAL_TABLET | ORAL | 0 refills | Status: DC

## 2017-07-12 MED ORDER — CYCLOBENZAPRINE HCL 10 MG PO TABS: 10.0000 mg | ORAL_TABLET | Freq: Every day | ORAL | 0 refills | Status: DC

## 2017-07-12 NOTE — Progress Notes (Signed)
   Subjective:    Patient ID: Matthew PigeonRalph Edward Ryles, male    DOB: 1961/01/19, 56 y.o.   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  bent over on Thursday, August 2 to tie his shoe and had acute low back pain. He [employer] lawn with a riding lawnmower on Saturday, August 4 and had worsening of back pain. He has been out of work for couple of days now. He tried ibuprofen with some relief. He is already on meloxicam. Can't get comfortable in the reclining chair at home.  No numbness in legs or feet. No weakness in feet.  At last visit we had considered changing his antihypertensive medication but decided to wait. Blood pressure is elevated today I think secondary to pain. We will not make any changes today and his blood pressure regimen.    Review of Systems pain aggravated by movement. No radiculopathy.     Objective:   Physical Exam Straight leg raising is negative at 90 bilaterally and muscle strength is normal in both lower extremities. Deep tendon reflexes 2+ and symmetrical in the knees. Range of motion of the trunk is fair.       Assessment & Plan:  Acute low back pain-suspect spasm rather than herniated disc  Essential hypertension-no changes made today and regimen  Plan: Note given for light duty. Return in one week. Sterapred DS 10 mg 6 day dosepak to take in tapering course as directed. Flexeril 10 mg one half tablet at bedtime. Stop meloxicam. He is going to be out of work today and tomorrow and return on Thursday, August 9.

## 2017-07-12 NOTE — Patient Instructions (Addendum)
Prednisone taper as directed. Flexeril at night one half tablet. Hold Meloxicam while on prednisone. Out of work 2 days. RTC  One week. Light duty.

## 2017-07-15 ENCOUNTER — Telehealth: Payer: Self-pay

## 2017-07-15 MED ORDER — TRAMADOL HCL 50 MG PO TABS: 50.0000 mg | ORAL_TABLET | Freq: Three times a day (TID) | ORAL | 0 refills | Status: DC | PRN

## 2017-07-15 NOTE — Telephone Encounter (Signed)
Call in Tramadol 50 mg #30 one po tid prn pain. Take with food. Does he want to go to physical therapy?

## 2017-07-15 NOTE — Telephone Encounter (Signed)
Wants to wait on Physical Therapy and will call back to let us know. Medication phoned in

## 2017-07-15 NOTE — Telephone Encounter (Signed)
Patient stated that he is still having pains but does not know what else can be taken for them. The prednisone has him feeling well in the morning but by 3pm he is in a lot of pain. Taking the 1/2 tablet of flexeril at night because the whole tablet really knocks him out. Please advise

## 2017-07-19 ENCOUNTER — Encounter: Payer: Self-pay | Admitting: Internal Medicine

## 2017-07-19 ENCOUNTER — Ambulatory Visit (INDEPENDENT_AMBULATORY_CARE_PROVIDER_SITE_OTHER): Payer: BLUE CROSS/BLUE SHIELD | Admitting: Internal Medicine

## 2017-07-19 VITALS — BP 138/88 | HR 87 | Temp 98.3°F | Wt 229.0 lb

## 2017-07-19 DIAGNOSIS — M545 Low back pain, unspecified: Secondary | ICD-10-CM

## 2017-07-19 DIAGNOSIS — I10 Essential (primary) hypertension: Secondary | ICD-10-CM

## 2017-07-19 MED ORDER — PREDNISONE 10 MG PO TABS: ORAL_TABLET | ORAL | 0 refills | Status: DC

## 2017-07-19 NOTE — Patient Instructions (Addendum)
Take Sterapred DS 10 mg 6 day dosepak in tapering course going from 60 mg to 0 mg over 7 days decreasing by 10 mg daily. Once that is finished you may resume meloxicam 15 mg daily. You have tramadol for pain and Flexeril. Blood pressure medication needs adjustment. Please call in a couple of weeks to follow-up on that. Remain on light duty for 2 weeks.

## 2017-07-19 NOTE — Progress Notes (Signed)
   Subjective:    Patient ID: Matthew Huff, male    DOB: [birth year], 56 y.o.   MRN: [id number]  HPI 56 year old Male seen on August 7 with acute bilateral back pain without sciatica. 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 He called on Friday saying he was in some pain. We called Tramadol in  for him and he got some relief. He was at work at the time that he called.  He rested over the weekend and has returned to work. He is not 100% he is about 65% better he says.  He has been treated with a steroid Dosepak for 6 days in tapering course which helped a great deal. Previously took meloxicam for musculoskeletal pain but that is on hold.  He also has hypertension which we've been monitoring. He has not wanted to change medications while dealing with this back issue.  He says he had a back injury a number of years ago that lasted for some 8 days in his 2920s.    Review of Systems see above     Objective:   Physical Exam Straight leg raising is negative at 90 in the lower extremities. Deep tendon reflexes 2+ and symmetrical with normal muscle strength. Range of motion of the trunk is good.       Assessment & Plan:  Bilateral low back pain slowly resolving. No radiculopathy. HEENT is going to be given another Sterapred 10 mg 6 day Dosepak to take in tapering course going from 60 mg to 0 mg over 7 days. He has tTamadol for pain. He will remain on light duty for 2 more weeks. We do need to address his blood pressure issues as BP is persistently elevated and he will continue to monitor his blood pressure at home and we will consider changing his medication after he has recovered from the back pain. Once he finishes his Sterapred Dosepak he may resume meloxicam. Also has Flexeril on hand.  Essential hypertension-see discussion above

## 2017-09-29 ENCOUNTER — Telehealth: Payer: Self-pay | Admitting: Internal Medicine

## 2017-09-29 MED ORDER — RAMIPRIL 10 MG PO CAPS: 10.0000 mg | ORAL_CAPSULE | Freq: Every day | ORAL | 0 refills | Status: DC

## 2017-09-29 NOTE — Telephone Encounter (Signed)
Patient calling for refill on his Ramipril 10mg .  No refills left on it.    Pharmacy:  [facility] [place] at [address].    Best # for contact:  [phone]  Thank you.

## 2017-09-29 NOTE — Telephone Encounter (Signed)
Pt is aware.  

## 2017-09-29 NOTE — Telephone Encounter (Signed)
Refill x 90 days 

## 2017-10-21 ENCOUNTER — Ambulatory Visit (INDEPENDENT_AMBULATORY_CARE_PROVIDER_SITE_OTHER): Payer: BLUE CROSS/BLUE SHIELD | Admitting: Internal Medicine

## 2017-10-21 DIAGNOSIS — Z23 Encounter for immunization: Secondary | ICD-10-CM | POA: Diagnosis not present

## 2017-10-21 NOTE — Progress Notes (Signed)
Flu Shot given

## 2017-10-21 NOTE — Patient Instructions (Signed)
Flu Shot Given

## 2017-11-21 ENCOUNTER — Telehealth: Payer: Self-pay | Admitting: Internal Medicine

## 2017-11-21 MED ORDER — MELOXICAM 15 MG PO TABS: 15.0000 mg | ORAL_TABLET | Freq: Every day | ORAL | 0 refills | Status: DC

## 2017-11-21 MED ORDER — AMLODIPINE BESYLATE 5 MG PO TABS: 5.0000 mg | ORAL_TABLET | Freq: Every day | ORAL | 0 refills | Status: DC

## 2017-11-21 NOTE — Telephone Encounter (Signed)
He needs appt in January for 6 month follow up refill x 30 days only

## 2017-11-21 NOTE — Telephone Encounter (Signed)
Tried calling patient twice and I was unable to leave a message.

## 2017-11-21 NOTE — Telephone Encounter (Signed)
Needs refill on his Amlodipine and Meloxicam.  Pharmacy:  [facility] [place] [facility] [place]  Best # for Contact:  [phone]  Thank you.

## 2017-11-21 NOTE — Telephone Encounter (Signed)
Scheduled appt, and sent both prescriptions to pharmacy.

## 2017-12-22 ENCOUNTER — Encounter: Payer: Self-pay | Admitting: Internal Medicine

## 2017-12-22 ENCOUNTER — Ambulatory Visit: Payer: BLUE CROSS/BLUE SHIELD | Admitting: Internal Medicine

## 2017-12-22 VITALS — BP 140/90 | HR 86 | Ht 70.5 in | Wt 232.0 lb

## 2017-12-22 DIAGNOSIS — I10 Essential (primary) hypertension: Secondary | ICD-10-CM

## 2017-12-22 MED ORDER — LOSARTAN POTASSIUM 100 MG PO TABS: 100.0000 mg | ORAL_TABLET | Freq: Every day | ORAL | 1 refills | Status: DC

## 2017-12-22 NOTE — Progress Notes (Signed)
   Subjective:    Patient ID: Matthew PigeonRalph Edward Huff, male    DOB: [birth year], 57 y.o.   MRN: [id number]  HPI 57 year old Male in today to follow-up on hypertension.   Work is going well for him.  He is busy.  Blood pressure is not as well-controlled as I would like to see it.  His BMI is 32.82.  Needs to watch his weight and get back into an exercise regimen.  Has been on ramipril 10 mg daily and amlodipine 5 mg daily for some time   Review of Systems     Objective:   Physical Exam  Blood pressure 140/90.  Chest clear.  Cardiac exam regular rate and rhythm.  Extremities without edema      Assessment & Plan:  Hypertension  BMI 32.82  Plan: Change ramipril to losartan 100 mg daily.  Continue amlodipine 5 mg daily and follow-up in 3 weeks.

## 2017-12-27 ENCOUNTER — Other Ambulatory Visit: Payer: Self-pay | Admitting: Internal Medicine

## 2018-01-05 NOTE — Patient Instructions (Addendum)
Discontinue ramipril.  Change to losartan 100 mg daily and continue Norvasc 5 mg daily.  It was a pleasure to see you today.

## 2018-01-12 ENCOUNTER — Encounter: Payer: Self-pay | Admitting: Internal Medicine

## 2018-01-12 ENCOUNTER — Ambulatory Visit: Payer: BLUE CROSS/BLUE SHIELD | Admitting: Internal Medicine

## 2018-01-12 VITALS — BP 130/88 | Ht 71.0 in | Wt 238.5 lb

## 2018-01-12 DIAGNOSIS — I10 Essential (primary) hypertension: Secondary | ICD-10-CM

## 2018-01-12 MED ORDER — HYDROCHLOROTHIAZIDE 25 MG PO TABS: 25.0000 mg | ORAL_TABLET | Freq: Every day | ORAL | 3 refills | Status: DC

## 2018-01-22 NOTE — Progress Notes (Deleted)
Full

## 2018-01-26 ENCOUNTER — Encounter: Payer: Self-pay | Admitting: Internal Medicine

## 2018-01-26 ENCOUNTER — Ambulatory Visit: Payer: BLUE CROSS/BLUE SHIELD | Admitting: Internal Medicine

## 2018-01-26 VITALS — BP 120/80 | HR 95 | Temp 98.1°F | Ht 71.0 in | Wt 236.0 lb

## 2018-01-26 DIAGNOSIS — L821 Other seborrheic keratosis: Secondary | ICD-10-CM

## 2018-01-26 DIAGNOSIS — I10 Essential (primary) hypertension: Secondary | ICD-10-CM | POA: Diagnosis not present

## 2018-01-26 DIAGNOSIS — Z79899 Other long term (current) drug therapy: Secondary | ICD-10-CM | POA: Diagnosis not present

## 2018-01-26 LAB — BASIC METABOLIC PANEL
BUN: 12 mg/dL (ref 7–25)
CO2: 29 mmol/L (ref 20–32)
Calcium: 10.2 mg/dL (ref 8.6–10.3)
Chloride: 100 mmol/L (ref 98–110)
Creat: 0.75 mg/dL (ref 0.70–1.33)
Glucose, Bld: 89 mg/dL (ref 65–139)
Potassium: 4 mmol/L (ref 3.5–5.3)
Sodium: 139 mmol/L (ref 135–146)

## 2018-01-26 MED ORDER — MELOXICAM 15 MG PO TABS: 15.0000 mg | ORAL_TABLET | Freq: Every day | ORAL | 0 refills | Status: DC

## 2018-01-26 NOTE — Progress Notes (Signed)
   Subjective:    Patient ID: Matthew Huff, male    DOB: 1961/11/29, 57 y.o.   MRN: [id number]  HPI Patient in today for blood pressure check.  At last visit we changed ramipril to losartan 100 mg daily still is not under good control.  Diastolic is 88.  He is at work today and work can be stressful.  Not exercising as much as he should and he is overweight a bit.    Review of Systems no new complaints     Objective:   Physical Exam Neck supple.  Chest clear.  Cardiac exam regular rate and rhythm normal S1 and 2.  Extremities without edema.       Assessment & Plan:  Essential hypertension  Family history of congestive heart failure in mother who had pacemaker  Plan: Encourage diet, exercise, and weight loss.  Add HCTZ to losartan 100 mg daily and amlodipine 5 mg daily and follow-up on February 21

## 2018-01-26 NOTE — Progress Notes (Signed)
   Subjective:    Patient ID: Matthew Huff, male    DOB: 22-Dec-1960, 57 y.o.   MRN: [id number]  HPI 57 year old Male in today for follow-up of hypertension.  In January we changed ramipril to losartan 100 mg daily and continued amlodipine 5 mg daily.  At last visit on February 7, we added HCTZ to this regimen.  Blood pressure has improved considerably.    Review of Systems he has a small seborrheic keratosis left anterior chest.     Objective:   Physical Exam  Skin warm and dry.  Nodes none.  Chest clear.  Cardiac exam regular rate and rhythm.  Extremities without edema.  Basic metabolic panel drawn and pending.  Feels well.      Assessment & Plan:  Essential hypertension-better after HCTZ has been added  Plan: He will continue to monitor his blood pressure at home.  Physical exam due in July.  Basic metabolic panel pending.  He will call with blood pressure is not well controlled.  Explained to him we could combine losartan HCTZ is 1 pill when he runs out of what he has

## 2018-01-26 NOTE — Patient Instructions (Signed)
Basic metabolic panel drawn and pending.  Continue losartan HCTZ and this can be combined into 1 pill when patient is ready to refill these prescriptions.  Continue Norvasc 5 mg daily.  Try to lose weight and exercise more.  Follow-up in July with physical exam or sooner if blood pressure not under control with home checks.

## 2018-01-26 NOTE — Patient Instructions (Signed)
Add HCTZ to losartan 100 mg daily and Norvasc 5 mg daily.  Follow-up February 21.

## 2018-02-13 ENCOUNTER — Other Ambulatory Visit: Payer: Self-pay | Admitting: Internal Medicine

## 2018-02-21 ENCOUNTER — Other Ambulatory Visit: Payer: Self-pay

## 2018-02-21 ENCOUNTER — Other Ambulatory Visit: Payer: Self-pay | Admitting: Internal Medicine

## 2018-03-22 ENCOUNTER — Telehealth: Payer: Self-pay | Admitting: Internal Medicine

## 2018-03-22 MED ORDER — LOSARTAN POTASSIUM-HCTZ 100-25 MG PO TABS: 1.0000 | ORAL_TABLET | Freq: Every day | ORAL | 1 refills | Status: DC

## 2018-03-22 NOTE — Telephone Encounter (Signed)
E-SCRIBED and advised patient stop the losartan 100mg  and HCTZ 25mg .

## 2018-03-22 NOTE — Telephone Encounter (Signed)
[facility]alph Edward [facility] [phone]  [facility]- on [address]  Rayna SextonRalph called to say he is now running low on his Blood Pressure medicine and would like for you to send in a prescription for it with the fluid pill combined with it like you discussed in last visit.

## 2018-03-22 NOTE — Telephone Encounter (Signed)
Call in Losartan HCTZ 100/25 (#90) with one refill and discontinue Losartan 100 mg and HCTZ 25 mg. This will now be combined into one pill.

## 2018-06-26 ENCOUNTER — Other Ambulatory Visit: Payer: Self-pay | Admitting: Internal Medicine

## 2018-08-18 ENCOUNTER — Other Ambulatory Visit: Payer: Self-pay | Admitting: Internal Medicine

## 2018-09-11 ENCOUNTER — Other Ambulatory Visit: Payer: Self-pay | Admitting: Internal Medicine

## 2018-09-12 ENCOUNTER — Other Ambulatory Visit: Payer: Self-pay

## 2018-09-12 MED ORDER — LOSARTAN POTASSIUM-HCTZ 100-25 MG PO TABS: 1.0000 | ORAL_TABLET | Freq: Every day | ORAL | 0 refills | Status: DC

## 2018-09-12 NOTE — Telephone Encounter (Signed)
Patient called to schedule a physical in January he needs a refill on losartan.

## 2018-09-13 ENCOUNTER — Other Ambulatory Visit: Payer: Self-pay | Admitting: Internal Medicine

## 2018-09-13 MED ORDER — LOSARTAN POTASSIUM-HCTZ 100-25 MG PO TABS: 1.0000 | ORAL_TABLET | Freq: Every day | ORAL | 0 refills | Status: DC

## 2018-10-02 ENCOUNTER — Other Ambulatory Visit: Payer: Self-pay | Admitting: Internal Medicine

## 2018-10-06 ENCOUNTER — Ambulatory Visit (INDEPENDENT_AMBULATORY_CARE_PROVIDER_SITE_OTHER): Payer: BLUE CROSS/BLUE SHIELD | Admitting: Internal Medicine

## 2018-10-06 DIAGNOSIS — Z23 Encounter for immunization: Secondary | ICD-10-CM

## 2018-10-06 NOTE — Patient Instructions (Signed)
Patient received a flu vaccine IM L deltoid, AV, CMA  

## 2018-11-20 ENCOUNTER — Ambulatory Visit: Payer: BLUE CROSS/BLUE SHIELD | Admitting: Internal Medicine

## 2018-11-20 ENCOUNTER — Ambulatory Visit
Admission: RE | Admit: 2018-11-20 | Discharge: 2018-11-20 | Disposition: A | Discharge status: Home / Self Care | Payer: BLUE CROSS/BLUE SHIELD | Source: Ambulatory Visit | Attending: Internal Medicine | Admitting: Internal Medicine

## 2018-11-20 VITALS — BP 140/82 | HR 100 | Temp 98.0°F | Resp 16 | Wt 231.0 lb

## 2018-11-20 DIAGNOSIS — W57XXXA Bitten or stung by nonvenomous insect and other nonvenomous arthropods, initial encounter: Secondary | ICD-10-CM | POA: Diagnosis not present

## 2018-11-20 DIAGNOSIS — M25572 Pain in left ankle and joints of left foot: Secondary | ICD-10-CM

## 2018-11-20 DIAGNOSIS — M19072 Primary osteoarthritis, left ankle and foot: Secondary | ICD-10-CM | POA: Diagnosis not present

## 2018-11-20 MED ORDER — TRAMADOL HCL 50 MG PO TABS: 50.0000 mg | ORAL_TABLET | Freq: Three times a day (TID) | ORAL | 0 refills | Status: DC | PRN

## 2018-11-20 MED ORDER — TRIAMCINOLONE ACETONIDE 0.1 % EX CREA: 1.0000 "application " | TOPICAL_CREAM | Freq: Three times a day (TID) | CUTANEOUS | 1 refills | Status: DC

## 2018-11-20 MED ORDER — PREDNISONE 10 MG PO TABS: ORAL_TABLET | ORAL | 0 refills | Status: DC

## 2018-11-20 NOTE — Progress Notes (Signed)
   Subjective:    Patient ID: Matthew Huff, male    DOB: May 16, 1961, 57 y.o.   MRN: [id number]  HPI Pt has hx of ankle fracture as a child. Surgery was advised but his father decided against it. It took several months for his ankle to heal. It really has not bothered him until recently. He went to the beach for a fishing trip some 3-4 weeks ago. Then spent a lot of time remodeling beach house going up and down stairs. 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   He purchased an ankle support and also an inner sole with arch support. This may have helped some.  He is worried and does not want to have surgery on ankle.    Review of Systems     Objective:   Physical Exam  There is no redness of the left ankle.  Currently not much tenderness.  Seems to have a bit of decreased range of motion with flexion.  Bony abnormality noted      Assessment & Plan:  Left ankle pain with remote hx of freacture. Obtain Xray  Insect bites both legs. Rx: triamcinolone cream tid x 7 days  Add: Has joint effusion on Xray. Maybe related to overexertion with remodeling beach house. Stop meloxicam. Tapering course of prednisone and call if not improved 10 days. May resume meloxicam after course of prednisone completed.

## 2018-11-27 ENCOUNTER — Telehealth: Payer: Self-pay | Admitting: Internal Medicine

## 2018-11-27 MED ORDER — PREDNISONE 10 MG PO TABS: ORAL_TABLET | ORAL | 0 refills | Status: DC

## 2018-11-27 NOTE — Telephone Encounter (Signed)
States that the Prednisone seemed to help.  The first day after he had completed the course, he felt great with no pain until that night.  The 2nd day, by noon he was having pain again.  He wants to know if you think he needs to repeat another course of Prednisone??    Phone:  [phone]  Thank you.

## 2018-11-27 NOTE — Telephone Encounter (Signed)
Refilled prednisone, pt was notified of instructions.

## 2018-11-27 NOTE — Telephone Encounter (Signed)
Refill Prednisone and let him know to repeat course as directed. May need to see Sports medicine doctor if not improving.

## 2018-12-05 ENCOUNTER — Encounter: Payer: Self-pay | Admitting: Internal Medicine

## 2018-12-05 NOTE — Patient Instructions (Signed)
Tapering course of prednisone and may resume meloxicam after course of prednisone completed.  Use triamcinolone cream on insect bites.  Might benefit from seeing sports medicine specialist if pain continues.

## 2018-12-08 ENCOUNTER — Other Ambulatory Visit: Payer: Self-pay | Admitting: Internal Medicine

## 2018-12-22 ENCOUNTER — Other Ambulatory Visit: Payer: BLUE CROSS/BLUE SHIELD | Admitting: Internal Medicine

## 2018-12-22 DIAGNOSIS — I10 Essential (primary) hypertension: Secondary | ICD-10-CM | POA: Diagnosis not present

## 2018-12-22 DIAGNOSIS — G8929 Other chronic pain: Secondary | ICD-10-CM

## 2018-12-22 DIAGNOSIS — M791 Myalgia, unspecified site: Secondary | ICD-10-CM | POA: Diagnosis not present

## 2018-12-22 DIAGNOSIS — M25562 Pain in left knee: Secondary | ICD-10-CM | POA: Diagnosis not present

## 2018-12-22 DIAGNOSIS — E7800 Pure hypercholesterolemia, unspecified: Secondary | ICD-10-CM | POA: Diagnosis not present

## 2018-12-22 DIAGNOSIS — M7918 Myalgia, other site: Secondary | ICD-10-CM

## 2018-12-22 DIAGNOSIS — Z0000 Encounter for general adult medical examination without abnormal findings: Secondary | ICD-10-CM

## 2018-12-22 LAB — COMPLETE METABOLIC PANEL WITH GFR
AG Ratio: 2.4 (calc) (ref 1.0–2.5)
ALBUMIN MSPROF: 4.5 g/dL (ref 3.6–5.1)
ALT: 14 U/L (ref 9–46)
AST: 16 U/L (ref 10–35)
Alkaline phosphatase (APISO): 38 U/L — ABNORMAL LOW (ref 40–115)
BUN: 11 mg/dL (ref 7–25)
CO2: 27 mmol/L (ref 20–32)
Calcium: 9.7 mg/dL (ref 8.6–10.3)
Chloride: 103 mmol/L (ref 98–110)
Creat: 0.79 mg/dL (ref 0.70–1.33)
GFR, Est African American: 116 mL/min/{1.73_m2} (ref >=60)
GFR, Est Non African American: 100 mL/min/{1.73_m2} (ref >=60)
Globulin: 1.9 g/dL (calc) (ref 1.9–3.7)
Glucose, Bld: 84 mg/dL (ref 65–99)
POTASSIUM: 4.2 mmol/L (ref 3.5–5.3)
Sodium: 140 mmol/L (ref 135–146)
Total Bilirubin: 0.6 mg/dL (ref 0.2–1.2)
Total Protein: 6.4 g/dL (ref 6.1–8.1)

## 2018-12-22 LAB — LIPID PANEL
Cholesterol: 210 mg/dL — ABNORMAL HIGH (ref <200)
HDL: 89 mg/dL (ref >40)
LDL CHOLESTEROL (CALC): 103 mg/dL — AB
Non-HDL Cholesterol (Calc): 121 mg/dL (calc) (ref <130)
Total CHOL/HDL Ratio: 2.4 (calc) (ref <5.0)
Triglycerides: 86 mg/dL (ref <150)

## 2018-12-22 LAB — CBC WITH DIFFERENTIAL/PLATELET
ABSOLUTE MONOCYTES: 530 {cells}/uL (ref 200–950)
BASOS ABS: 51 {cells}/uL (ref 0–200)
Basophils Relative: 0.9 %
EOS ABS: 29 {cells}/uL (ref 15–500)
EOS PCT: 0.5 %
HEMATOCRIT: 43.9 % (ref 38.5–50.0)
Hemoglobin: 15.5 g/dL (ref 13.2–17.1)
LYMPHS ABS: 1539 {cells}/uL (ref 850–3900)
MCH: 33.3 pg — ABNORMAL HIGH (ref 27.0–33.0)
MCHC: 35.3 g/dL (ref 32.0–36.0)
MCV: 94.2 fL (ref 80.0–100.0)
MPV: 9.9 fL (ref 7.5–12.5)
Monocytes Relative: 9.3 %
NEUTROS ABS: 3551 {cells}/uL (ref 1500–7800)
NEUTROS PCT: 62.3 %
Platelets: 213 10*3/uL (ref 140–400)
RBC: 4.66 10*6/uL (ref 4.20–5.80)
RDW: 12.7 % (ref 11.0–15.0)
Total Lymphocyte: 27 %
WBC: 5.7 10*3/uL (ref 3.8–10.8)

## 2018-12-22 LAB — PSA: PSA: 2.5 ng/mL (ref <=4.0)

## 2018-12-25 ENCOUNTER — Ambulatory Visit (INDEPENDENT_AMBULATORY_CARE_PROVIDER_SITE_OTHER): Payer: BLUE CROSS/BLUE SHIELD | Admitting: Internal Medicine

## 2018-12-25 ENCOUNTER — Encounter: Payer: Self-pay | Admitting: Internal Medicine

## 2018-12-25 VITALS — BP 150/100 | HR 89 | Temp 98.4°F | Ht 71.0 in | Wt 234.0 lb

## 2018-12-25 DIAGNOSIS — Z0000 Encounter for general adult medical examination without abnormal findings: Secondary | ICD-10-CM

## 2018-12-25 DIAGNOSIS — M25572 Pain in left ankle and joints of left foot: Secondary | ICD-10-CM

## 2018-12-25 DIAGNOSIS — Z6832 Body mass index (BMI) 32.0-32.9, adult: Secondary | ICD-10-CM

## 2018-12-25 DIAGNOSIS — I10 Essential (primary) hypertension: Secondary | ICD-10-CM

## 2018-12-25 DIAGNOSIS — M79672 Pain in left foot: Secondary | ICD-10-CM | POA: Diagnosis not present

## 2018-12-25 DIAGNOSIS — L309 Dermatitis, unspecified: Secondary | ICD-10-CM

## 2018-12-25 LAB — POCT URINALYSIS DIPSTICK
Appearance: NEGATIVE
Bilirubin, UA: NEGATIVE
Blood, UA: NEGATIVE
Glucose, UA: NEGATIVE
KETONES UA: NEGATIVE
Leukocytes, UA: NEGATIVE
Nitrite, UA: NEGATIVE
ODOR: NEGATIVE
Protein, UA: NEGATIVE
Spec Grav, UA: 1.015 (ref 1.010–1.025)
Urobilinogen, UA: 0.2 E.U./dL
pH, UA: 6 (ref 5.0–8.0)

## 2018-12-25 NOTE — Progress Notes (Signed)
Subjective:    Patient ID: Matthew Huff, male    DOB: Dec 18, 1960, 58 y.o.   MRN: [id number]  HPI 58 year old male in today for health maintenance exam and evaluation of medical issues including history of essential hypertension.  He developed a dermatitis after going to the beach on a fishing trip and also remodeling his condominium.  He thought it was insect bites initially but mainly on his legs.  There were discrete itchy papules.  These have persisted despite treatment with triamcinolone cream and a course of prednisone which was prescribed for ankle pain.  Someone at work made shoe insert for his left shoe which helped his ankle pain some.  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  See recent x-ray.  Says this has improved.  He used to do a lot of hiking and would like to get back into this again but ankle pain has prevented him from doing this.  He has gained weight.  At one point he lost about 50 pounds and was in great shape a few years ago.  Lowest weight over the past few years was 205 pounds.  History of low back pain without sciatica in 2018.  Says he had a back injury a number of years ago and it lasted for some 8 days in his 80s.  Apparently has been treated with Mobic for musculoskeletal pain.  History of left shoulder arthropathy injected by Dewaine Conger in 2016  History of left knee pain seen by Dr. Charlett Blake February 2018.  Report indicated he likely had meniscal tear.  This occurred at work when he slipped.  Fractured right arm at age 75.  History of fractured ankle at age 44.  No known drug allergies.  History of hyperlipidemia but once he lost weight he was able to come off of Lipitor.  Family history: Mother  with history of diabetes and coronary artery disease died of congestive heart failure complications, father died with history of hypertension and atherosclerotic cardiovascular disease and dementia.  One sister overweight.  Social  history: He is divorced.  One adult daughter who is a Engineer, civil (consulting) in [place] and is married.  No grandchildren.  Patient does not smoke.  Social alcohol consumption.  He works as a Curator at [employer].  He has a history of office hypertension.  Never had colonoscopy  Had tetanus immunization 2013  Had flu vaccine November 2019      Review of Systems see above main complaint is itchy rash on legs, ankle and foot pain     Objective:   Physical Exam Vitals signs reviewed.  Constitutional:      General: He is not in acute distress.    Appearance: Normal appearance. He is obese. He is not ill-appearing.  HENT:     Head: Normocephalic and atraumatic.     Right Ear: Tympanic membrane normal.     Mouth/Throat:     Mouth: Mucous membranes are moist.     Pharynx: Oropharynx is clear.  Eyes:     Extraocular Movements: Extraocular movements intact.     Pupils: Pupils are equal, round, and reactive to light.  Neck:     Musculoskeletal: Neck supple. No neck rigidity.  Cardiovascular:     Rate and Rhythm: Normal rate and regular rhythm.     Heart sounds: No murmur.  Pulmonary:     Effort: Pulmonary effort is normal. No respiratory distress.     Breath sounds: Normal  breath sounds. No wheezing.  Abdominal:     General: There is no distension.     Palpations: Abdomen is soft. There is no mass.     Tenderness: There is no guarding or rebound.  Genitourinary:    Prostate: Normal.  Musculoskeletal:     Right lower leg: No edema.     Left lower leg: No edema.     Comments: No significant pain left ankle today  Lymphadenopathy:     Cervical: No cervical adenopathy.  Skin:    General: Skin is warm and dry.     Comments: Multiple discrete erythematous papules on lower extremities  Neurological:     General: No focal deficit present.     Mental Status: He is alert.  Psychiatric:        Mood and Affect: Mood normal.        Behavior: Behavior normal.        Thought  Content: Thought content normal.        Judgment: Judgment normal.    BP rechecked 140/90 pulse 89, BMI 32.64       Assessment & Plan:  Elevated BP- office HTN-- Watch BP at home and call if elevated. RTC 6 months  Dermatitis- have Dermatology check rash that is itchy for scabies. This started after fishing trip and also when he was repairing condominium at beach  Left ankle deformity and foot pain - to see Dr. Darrick Penna for orthotics and also evaluate bilateral knee pain as pt would like to hike  Obesity- needs to diet and exercise- lost weight when he was hiking

## 2018-12-26 ENCOUNTER — Other Ambulatory Visit: Payer: Self-pay | Admitting: Internal Medicine

## 2018-12-27 ENCOUNTER — Telehealth: Payer: Self-pay

## 2018-12-27 NOTE — Telephone Encounter (Signed)
Patient called to see if we have an appointment for him for Dermatology?

## 2018-12-29 NOTE — Patient Instructions (Signed)
To see sports medicine physician regarding chronic foot and ankle pain to see if orthotics are appropriate.  To see dermatologist regarding dermatitis lower extremities it will not resolve  Continue to monitor blood pressure at home.  Call if persistently elevated.  Continue same medications.  Watch diet and exercise.  Return in 6 months.

## 2019-01-01 NOTE — Telephone Encounter (Signed)
Dr. Margo Aye could not see patient until 2/4. Called patient back and made him aware.  Faxed his insurance card over to [facility] and he will keep that appointment for 2/3 @ 3pm.

## 2019-01-01 NOTE — Telephone Encounter (Signed)
Patient has appointment with [facility] 2/3 @ 3:00 p.m. Patient still not happy with this timeframe, so I will try at Dr. Gean Birchwood office to see if they have anything any sooner.

## 2019-01-08 DIAGNOSIS — L814 Other melanin hyperpigmentation: Secondary | ICD-10-CM | POA: Diagnosis not present

## 2019-01-08 DIAGNOSIS — R21 Rash and other nonspecific skin eruption: Secondary | ICD-10-CM | POA: Diagnosis not present

## 2019-01-08 DIAGNOSIS — D485 Neoplasm of uncertain behavior of skin: Secondary | ICD-10-CM | POA: Diagnosis not present

## 2019-01-08 DIAGNOSIS — D225 Melanocytic nevi of trunk: Secondary | ICD-10-CM | POA: Diagnosis not present

## 2019-01-08 DIAGNOSIS — L309 Dermatitis, unspecified: Secondary | ICD-10-CM | POA: Diagnosis not present

## 2019-01-08 DIAGNOSIS — L011 Impetiginization of other dermatoses: Secondary | ICD-10-CM | POA: Diagnosis not present

## 2019-02-20 ENCOUNTER — Other Ambulatory Visit: Payer: Self-pay | Admitting: Internal Medicine

## 2019-06-25 ENCOUNTER — Ambulatory Visit: Payer: BC Managed Care – PPO | Admitting: Internal Medicine

## 2019-06-25 ENCOUNTER — Other Ambulatory Visit: Payer: Self-pay

## 2019-06-25 ENCOUNTER — Encounter: Payer: Self-pay | Admitting: Internal Medicine

## 2019-06-25 VITALS — BP 150/80 | HR 98 | Ht 71.0 in | Wt 230.0 lb

## 2019-06-25 DIAGNOSIS — L309 Dermatitis, unspecified: Secondary | ICD-10-CM | POA: Diagnosis not present

## 2019-06-25 DIAGNOSIS — I10 Essential (primary) hypertension: Secondary | ICD-10-CM

## 2019-06-25 DIAGNOSIS — Z6832 Body mass index (BMI) 32.0-32.9, adult: Secondary | ICD-10-CM | POA: Diagnosis not present

## 2019-06-25 NOTE — Progress Notes (Signed)
   Subjective:    Patient ID: Matthew Huff, male    DOB: October 19, 1961, 58 y.o.   MRN: [id number]  HPI 58 year old Male with history of hypertension and obesity in today for follow-up.  He said his blood pressures been excellent at home.  Does not check it at work.  Work has been fairly steady.  He has been social distancing and staying mostly at home.  Would like to go to the beach to finish but will wait until Fall.  Used to do lots of hiking with a group but that has been discontinued due to the pandemic.  Needs to get more exercise outside of work.  He currently works as a Dealer at [employer] history of eczema or psoriasis on the lower extremities.  Requesting that triamcinolone cream prescribed by Dr. Elvera Lennox be refilled.     Objective:   Physical Exam Blood pressure today 150/80 and rechecked was 140/80.  BMI is 32.08.  Weight is 230 pounds.  Chest clear.  Cardiac exam regular rate and rhythm.       Assessment & Plan:  Essential hypertension-readings are stable at home.  I think he has an element of office hypertension.  Continue to monitor.  Elevated BMI-needs to diet exercise and lose some weight.  This will help blood pressure as well.  Lower extremity dermatitis-treated by Dr. Elvera Lennox with triamcinolone cream perhaps with Eucerin added.  This will be renewed  Plan: He needs follow-up in 6 months.

## 2019-06-25 NOTE — Patient Instructions (Signed)
Continue to monitor blood pressure at home. Watch diet and walk some. RTC in 6 months. Cream for dermatitis will be refilled.

## 2019-09-19 ENCOUNTER — Other Ambulatory Visit: Payer: Self-pay

## 2019-09-19 MED ORDER — LOSARTAN POTASSIUM-HCTZ 100-25 MG PO TABS: ORAL_TABLET | ORAL | 1 refills | Status: DC

## 2019-09-26 ENCOUNTER — Encounter: Payer: Self-pay | Admitting: Internal Medicine

## 2019-09-26 ENCOUNTER — Ambulatory Visit (INDEPENDENT_AMBULATORY_CARE_PROVIDER_SITE_OTHER): Payer: BC Managed Care – PPO | Admitting: Internal Medicine

## 2019-09-26 ENCOUNTER — Other Ambulatory Visit: Payer: Self-pay

## 2019-09-26 VITALS — BP 130/80 | HR 84 | Temp 97.9°F | Ht 71.0 in | Wt 230.0 lb

## 2019-09-26 DIAGNOSIS — Z23 Encounter for immunization: Secondary | ICD-10-CM | POA: Diagnosis not present

## 2019-09-26 DIAGNOSIS — I10 Essential (primary) hypertension: Secondary | ICD-10-CM

## 2019-09-26 NOTE — Patient Instructions (Signed)
Patient received a flu vaccine IM L deltoid, AV, CMA  

## 2019-09-26 NOTE — Progress Notes (Signed)
Flu vaccine given by CMA 

## 2019-12-17 ENCOUNTER — Telehealth: Payer: Self-pay | Admitting: Internal Medicine

## 2019-12-17 MED ORDER — AMLODIPINE BESYLATE 5 MG PO TABS: 5.0000 mg | ORAL_TABLET | Freq: Every day | ORAL | 3 refills | Status: DC

## 2019-12-17 NOTE — Addendum Note (Signed)
Addended by: Gregery Na on: 12/17/2019 11:41 AM   Modules accepted: Orders

## 2019-12-17 NOTE — Telephone Encounter (Signed)
Received Fax RX request from  Pharmacy [id number] - [place], [place] - [address] AT [facility] OF [facility] [address] [facility] [address] Phone:  [phone]  Fax:  [phone]       Medication - amLODipine (NORVASC) 5 MG tablet    Last Refill - 09/19/19  Last OV - 06/25/19  Last CPE - 12/25/18  Next Appointment - 01/07/2020

## 2019-12-31 ENCOUNTER — Other Ambulatory Visit: Payer: BC Managed Care – PPO | Admitting: Internal Medicine

## 2019-12-31 ENCOUNTER — Other Ambulatory Visit: Payer: Self-pay

## 2019-12-31 DIAGNOSIS — E785 Hyperlipidemia, unspecified: Secondary | ICD-10-CM

## 2019-12-31 DIAGNOSIS — Z0000 Encounter for general adult medical examination without abnormal findings: Secondary | ICD-10-CM | POA: Diagnosis not present

## 2019-12-31 DIAGNOSIS — I10 Essential (primary) hypertension: Secondary | ICD-10-CM

## 2019-12-31 DIAGNOSIS — E7800 Pure hypercholesterolemia, unspecified: Secondary | ICD-10-CM | POA: Diagnosis not present

## 2019-12-31 DIAGNOSIS — Z125 Encounter for screening for malignant neoplasm of prostate: Secondary | ICD-10-CM | POA: Diagnosis not present

## 2019-12-31 LAB — CBC WITH DIFFERENTIAL/PLATELET
Absolute Monocytes: 689 cells/uL (ref 200–950)
Basophils Absolute: 50 cells/uL (ref 0–200)
Basophils Relative: 0.6 %
Eosinophils Absolute: 42 cells/uL (ref 15–500)
Eosinophils Relative: 0.5 %
HCT: 48 % (ref 38.5–50.0)
Hemoglobin: 16.7 g/dL (ref 13.2–17.1)
Lymphs Abs: 1685 cells/uL (ref 850–3900)
MCH: 33.3 pg — ABNORMAL HIGH (ref 27.0–33.0)
MCHC: 34.8 g/dL (ref 32.0–36.0)
MCV: 95.6 fL (ref 80.0–100.0)
MPV: 9.9 fL (ref 7.5–12.5)
Monocytes Relative: 8.3 %
Neutro Abs: 5835 cells/uL (ref 1500–7800)
Neutrophils Relative %: 70.3 %
Platelets: 239 10*3/uL (ref 140–400)
RBC: 5.02 10*6/uL (ref 4.20–5.80)
RDW: 12.5 % (ref 11.0–15.0)
Total Lymphocyte: 20.3 %
WBC: 8.3 10*3/uL (ref 3.8–10.8)

## 2019-12-31 LAB — COMPLETE METABOLIC PANEL WITH GFR
AG Ratio: 2.3 (calc) (ref 1.0–2.5)
ALT: 24 U/L (ref 9–46)
AST: 22 U/L (ref 10–35)
Albumin: 5.2 g/dL — ABNORMAL HIGH (ref 3.6–5.1)
Alkaline phosphatase (APISO): 52 U/L (ref 35–144)
BUN: 11 mg/dL (ref 7–25)
CO2: 27 mmol/L (ref 20–32)
Calcium: 9.9 mg/dL (ref 8.6–10.3)
Chloride: 99 mmol/L (ref 98–110)
Creat: 0.71 mg/dL (ref 0.70–1.33)
GFR, Est African American: 121 mL/min/{1.73_m2} (ref >=60)
GFR, Est Non African American: 104 mL/min/{1.73_m2} (ref >=60)
Globulin: 2.3 g/dL (calc) (ref 1.9–3.7)
Glucose, Bld: 88 mg/dL (ref 65–99)
Potassium: 3.8 mmol/L (ref 3.5–5.3)
Sodium: 140 mmol/L (ref 135–146)
Total Bilirubin: 0.7 mg/dL (ref 0.2–1.2)
Total Protein: 7.5 g/dL (ref 6.1–8.1)

## 2019-12-31 LAB — LIPID PANEL
Cholesterol: 258 mg/dL — ABNORMAL HIGH (ref <200)
HDL: 114 mg/dL (ref >=40)
LDL Cholesterol (Calc): 128 mg/dL (calc) — ABNORMAL HIGH
Non-HDL Cholesterol (Calc): 144 mg/dL (calc) — ABNORMAL HIGH (ref <130)
Total CHOL/HDL Ratio: 2.3 (calc) (ref <5.0)
Triglycerides: 68 mg/dL (ref <150)

## 2019-12-31 LAB — PSA: PSA: 2.7 ng/mL (ref <=4.0)

## 2020-01-07 ENCOUNTER — Encounter: Payer: Self-pay | Admitting: Internal Medicine

## 2020-01-07 ENCOUNTER — Ambulatory Visit (INDEPENDENT_AMBULATORY_CARE_PROVIDER_SITE_OTHER): Payer: BC Managed Care – PPO | Admitting: Internal Medicine

## 2020-01-07 ENCOUNTER — Other Ambulatory Visit: Payer: Self-pay

## 2020-01-07 VITALS — BP 130/90 | HR 92 | Ht 71.0 in | Wt 229.0 lb

## 2020-01-07 DIAGNOSIS — E7800 Pure hypercholesterolemia, unspecified: Secondary | ICD-10-CM | POA: Diagnosis not present

## 2020-01-07 DIAGNOSIS — M7918 Myalgia, other site: Secondary | ICD-10-CM | POA: Diagnosis not present

## 2020-01-07 DIAGNOSIS — Z0000 Encounter for general adult medical examination without abnormal findings: Secondary | ICD-10-CM

## 2020-01-07 DIAGNOSIS — I10 Essential (primary) hypertension: Secondary | ICD-10-CM | POA: Diagnosis not present

## 2020-01-07 LAB — POCT URINALYSIS DIPSTICK
Appearance: NEGATIVE
Bilirubin, UA: NEGATIVE
Blood, UA: NEGATIVE
Glucose, UA: NEGATIVE
Ketones, UA: NEGATIVE
Leukocytes, UA: NEGATIVE
Nitrite, UA: NEGATIVE
Odor: NEGATIVE
Protein, UA: NEGATIVE
Spec Grav, UA: 1.02 (ref 1.010–1.025)
Urobilinogen, UA: 0.2 E.U./dL
pH, UA: 6.5 (ref 5.0–8.0)

## 2020-01-07 NOTE — Progress Notes (Signed)
   Subjective:    Patient ID: Matthew Huff, male    DOB: 1961/03/03, 59 y.o.   MRN: [id number]  HPI 59 year old Male for health maintenance exam and evaluation of medical issues including essential hypertension and obesity.  History of low back pain without sciatica 2018.  Had a back injury in his 20s lasting some 8 days.  History of left shoulder arthropathy injected by Delbert Harness in 2016  Takes Mobic for musculoskeletal pain  History of left knee pain seen by Dr. Charlett Blake February 2018.  Report indicated he likely had a meniscal tear.  This occurred at work when he slipped.  Fractured right arm at age 60.  History of fractured ankle at age 14.  No known drug allergies  History of hyperlipidemia but once he lost weight he was able to come off of Lipitor  Social history: He is divorced.  1 adult daughter married who lives in [place].  No grandchildren.  And resides alone.  Enjoys fishing.  He works as a Curator at [employer].  Has history of office hypertension.  Colonoscopy needed  Family history: Mother with history of diabetes and coronary artery disease died of congestive heart failure complications.  Father died with history of hypertension and atherosclerotic cardiovascular disease and dementia.  One sister overweight.    Review of Systems no new complaints    Objective:   Physical Exam Blood pressure 130/90 BMI 31.94 weight 229 pounds pulse 92 pulse oximetry 98%.  Previous weight in January 2020 was 234 pounds.   Skin warm and dry.  Nodes none.  TMs are clear.  Neck is supple without JVD thyromegaly or carotid bruits.  Chest clear to auscultation.  Cardiac exam regular rate and rhythm normal S1 and S2 without murmurs or gallops.  Abdomen soft nondistended without hepatosplenomegaly masses or tenderness.  Prostate is normal without nodules.  No lower extremity edema.  Neuro intact without focal deficits.  Affect is normal.       Assessment &  Plan:  Essential hypertension-stable with HCTZ, losartan, amlodipine.  BMI 31.94  Musculoskeletal pain treated with Mobic  Hyperlipidemia-total cholesterol was 258 and was 210 a year ago.  LDL has increased from 103 to 128  Plan: He will continue to work on diet exercise and weight loss.  Continue current medications and follow-up in 6 months.  Reminded about colonoscopy.  If lipids remain elevated he will need to go back on statin medication.

## 2020-01-09 NOTE — Patient Instructions (Addendum)
It was a pleasure to see you today.  Continue current medications and follow-up in 6 months.  Watch diet, try to lose some weight and exercise more.  We will recheck lipids in 6 months.

## 2020-02-11 ENCOUNTER — Ambulatory Visit: Payer: BC Managed Care – PPO | Attending: Internal Medicine

## 2020-02-11 DIAGNOSIS — Z23 Encounter for immunization: Secondary | ICD-10-CM | POA: Insufficient documentation

## 2020-02-11 NOTE — Progress Notes (Signed)
   Covid-19 Vaccination Clinic  Name:  Harshith Pursell    MRN: [id number] DOB: [birth year]  02/11/2020  Mr. Wentzel was observed post Covid-19 immunization for 15 minutes without incident. He was provided with Vaccine Information Sheet and instruction to access the V-Safe system.   Mr. Dolata was instructed to call 911 with any severe reactions post vaccine: Marland Kitchen Difficulty breathing  . Swelling of face and throat  . A fast heartbeat  . A bad rash all over body  . Dizziness and weakness   Immunizations Administered    Name Date Dose VIS Date Route   Pfizer COVID-19 Vaccine 02/11/2020  5:44 PM 0.3 mL 11/16/2019 Intramuscular   Manufacturer: [employer], [employer]   Lot: [id number]   NDC: [address]-2

## 2020-03-03 ENCOUNTER — Ambulatory Visit: Payer: BC Managed Care – PPO | Attending: Internal Medicine

## 2020-03-03 DIAGNOSIS — Z23 Encounter for immunization: Secondary | ICD-10-CM

## 2020-03-03 NOTE — Progress Notes (Signed)
   Covid-19 Vaccination Clinic  Name:  Matthew Huff    MRN: [id number] DOB: [birth year]  03/03/2020  Matthew Huff was observed post Covid-19 immunization for 15 minutes without incident. He was provided with Vaccine Information Sheet and instruction to access the V-Safe system.   Matthew Huff was instructed to call 911 with any severe reactions post vaccine: Marland Kitchen Difficulty breathing  . Swelling of face and throat  . A fast heartbeat  . A bad rash all over body  . Dizziness and weakness   Immunizations Administered    Name Date Dose VIS Date Route   Pfizer COVID-19 Vaccine 03/03/2020  8:44 AM 0.3 mL 11/16/2019 Intramuscular   Manufacturer: [employer], [employer]   Lot: [id number]   NDC: [address]-2

## 2020-03-14 ENCOUNTER — Telehealth: Payer: Self-pay | Admitting: Internal Medicine

## 2020-03-14 MED ORDER — LOSARTAN POTASSIUM-HCTZ 100-25 MG PO TABS: ORAL_TABLET | ORAL | 1 refills | Status: DC

## 2020-03-14 NOTE — Telephone Encounter (Signed)
Received Fax RX request from  Pharmacy [id number] - [place], [place] - [address] AT [facility] OF [facility] Phone:  [phone]  Fax:  [phone]      Medication - losartan-hydrochlorothiazide (HYZAAR) 100-25 MG tablet   Last Refill - 12/16/19  Last OV - 01/07/20  Last CPE - 01/07/20  Next Appointment - 07/08/20

## 2020-03-14 NOTE — Telephone Encounter (Signed)
Refill x 6 months 

## 2020-03-17 ENCOUNTER — Other Ambulatory Visit: Payer: Self-pay

## 2020-03-17 MED ORDER — MELOXICAM 15 MG PO TABS: 15.0000 mg | ORAL_TABLET | Freq: Every day | ORAL | 1 refills | Status: DC

## 2020-07-08 ENCOUNTER — Ambulatory Visit: Payer: BC Managed Care – PPO | Admitting: Internal Medicine

## 2020-07-08 ENCOUNTER — Encounter: Payer: Self-pay | Admitting: Internal Medicine

## 2020-07-08 ENCOUNTER — Other Ambulatory Visit: Payer: Self-pay

## 2020-07-08 VITALS — BP 110/80 | HR 85 | Temp 98.0°F | Ht 71.0 in | Wt 220.0 lb

## 2020-07-08 DIAGNOSIS — R7989 Other specified abnormal findings of blood chemistry: Secondary | ICD-10-CM | POA: Diagnosis not present

## 2020-07-08 DIAGNOSIS — E7800 Pure hypercholesterolemia, unspecified: Secondary | ICD-10-CM | POA: Diagnosis not present

## 2020-07-08 DIAGNOSIS — Z6830 Body mass index (BMI) 30.0-30.9, adult: Secondary | ICD-10-CM

## 2020-07-08 DIAGNOSIS — I10 Essential (primary) hypertension: Secondary | ICD-10-CM

## 2020-07-08 LAB — LIPID PANEL
Cholesterol: 228 mg/dL — ABNORMAL HIGH (ref <200)
HDL: 112 mg/dL (ref >=40)
LDL Cholesterol (Calc): 101 mg/dL (calc) — ABNORMAL HIGH
Non-HDL Cholesterol (Calc): 116 mg/dL (calc) (ref <130)
Total CHOL/HDL Ratio: 2 (calc) (ref <5.0)
Triglycerides: 63 mg/dL (ref <150)

## 2020-07-08 NOTE — Progress Notes (Signed)
   Subjective:    Patient ID: Matthew Huff, male    DOB: 03-Jan-1961, 59 y.o.   MRN: [id number]  HPI 59 year old Male for 6 month recheck. Feels well. Has had 2 Covid 19 vaccinations.  He has a history of hypertension.  He is not on lipid-lowering medication and does not want to be.  I think this is fine given his high HDL and is near normal LDL.  Total cholesterol was 228, HDL is excellent at 120 oh and LDL is near normal at 101.  Previously in January LDL was 128.  He has lost 9 pounds since February which is excellent.    Review of Systems no new complaints     Objective:   Physical Exam Blood pressure excellent at 110/80.  Pulse 85 regular temperature 98 degrees orally pulse oximetry 99% weight 220 pounds  Skin warm and dry.  Nodes none.  Neck is supple without JVD thyromegaly or carotid bruits.  Chest is clear to auscultation.  Cardiac exam regular rate and rhythm normal S1 and S2 without murmurs or gallops.  No lower extremity pitting edema.       Assessment & Plan:  Essential hypertension under excellent control on current regimen  Lipids are under excellent control and he has a high HDL cholesterol.  His total cholesterol looks high but his bad cholesterol is near normal at 101.  His HDL (good cholesterol) is 322 which is excellent  BMI 30 and she has lost 9 pounds since February which is excellent  Plan: Continue to encourage diet and exercise efforts.  Continue current medications and follow-up in February 2022.

## 2020-07-08 NOTE — Patient Instructions (Addendum)
It was a pleasure to see you today. Watch diet and exercise. Try to lose a bit more weight.  No change in medications.  Follow-up in February 2022.

## 2020-07-28 ENCOUNTER — Telehealth: Payer: Self-pay | Admitting: Internal Medicine

## 2020-07-28 NOTE — Telephone Encounter (Signed)
Pt was notified of results and instructions, pt verbalized understanding.   

## 2020-07-28 NOTE — Telephone Encounter (Signed)
Matthew Huff [id number] [phone]  Matthew Huff called to see what his lab results was from his last office visit. I did ask him again if he would be interested in Mychart and he said maybe he should sign up for that, so I sent him a link. He would like know what the results are.

## 2020-08-21 ENCOUNTER — Ambulatory Visit (INDEPENDENT_AMBULATORY_CARE_PROVIDER_SITE_OTHER): Payer: BC Managed Care – PPO | Admitting: Internal Medicine

## 2020-08-21 ENCOUNTER — Other Ambulatory Visit: Payer: Self-pay

## 2020-08-21 ENCOUNTER — Encounter: Payer: Self-pay | Admitting: Internal Medicine

## 2020-08-21 ENCOUNTER — Telehealth: Payer: Self-pay

## 2020-08-21 VITALS — Temp 99.0°F

## 2020-08-21 DIAGNOSIS — I10 Essential (primary) hypertension: Secondary | ICD-10-CM

## 2020-08-21 DIAGNOSIS — B349 Viral infection, unspecified: Secondary | ICD-10-CM

## 2020-08-21 DIAGNOSIS — R197 Diarrhea, unspecified: Secondary | ICD-10-CM | POA: Diagnosis not present

## 2020-08-21 NOTE — Telephone Encounter (Signed)
Car visit 11:30

## 2020-08-21 NOTE — Progress Notes (Signed)
   Subjective:    Patient ID: Matthew Huff, male    DOB: February 22, 1961, 59 y.o.   MRN: [id number]  HPI 59 year old Male with history of hypertension well-controlled developed diarrhea on Tuesday, September 14 with a temperature of 101.1 degrees.  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  Temperature is continued to be low-grade.  He was worried about accuracy of his thermometer.  Diarrhea is improving.  No dysgeusia shaking chills or significant myalgias.  He has been out of work since September 14.  Has appetite.  Has eaten today.  Has had 2 COVID-19 immunizations in March of this year.  Due to the COVID-19 pandemic patient was seen just outside the office.  He is identified using 2 identifiers as Matthew Huff, a longstanding patient in this practice.    Review of Systems see above     Objective:   Physical Exam  Temperature is 99 degrees via ear thermometer.  He is seen in no acute distress.  COVID-19 testing done via PCR nasal swab.      Assessment & Plan:  Gastroenteritis-likely viral.  Rule out COVID-19 infection.  PCR swab obtained and pending.  Malaise-patient is to remain out of work through tomorrow and a note was provided for his employer.  He is to stay with clear liquids and advance diet slowly.  Does not need medication for nausea.  Call if not improving in 24 to 48 hours or sooner if worse.

## 2020-08-21 NOTE — Telephone Encounter (Signed)
Schedule car visit 

## 2020-08-21 NOTE — Patient Instructions (Addendum)
COVID-19 PCR test obtained.  Stay with clear liquids and advance diet slowly.  Note provided to be out of work for remainder of week.

## 2020-08-21 NOTE — Telephone Encounter (Signed)
Patient called had diarrhea on Tuesday 08/19/20 and fever of 101.1, he said he hasn't been around anyone with COVID that he knows of. He does not have diarrhea anymore but has a temperature of 100 and 99.9, he wants to know if he should get tested for covid? Or could this be a stomach bug?

## 2020-08-22 LAB — SARS-COV-2 RNA,(COVID-19) QUALITATIVE NAAT: SARS CoV2 RNA: NOT DETECTED

## 2020-09-10 ENCOUNTER — Other Ambulatory Visit: Payer: Self-pay | Admitting: Internal Medicine

## 2020-09-10 NOTE — Telephone Encounter (Signed)
He takes Mobic almost every day Send in #90 with on refill and refill BP meds x 90 days

## 2020-11-05 ENCOUNTER — Other Ambulatory Visit: Payer: Self-pay

## 2020-11-05 ENCOUNTER — Encounter: Payer: Self-pay | Admitting: Internal Medicine

## 2020-11-05 ENCOUNTER — Ambulatory Visit (INDEPENDENT_AMBULATORY_CARE_PROVIDER_SITE_OTHER): Payer: BC Managed Care – PPO | Admitting: Internal Medicine

## 2020-11-05 VITALS — BP 120/70 | HR 74 | Temp 98.3°F | Ht 71.0 in | Wt 220.0 lb

## 2020-11-05 DIAGNOSIS — Z23 Encounter for immunization: Secondary | ICD-10-CM | POA: Diagnosis not present

## 2020-11-05 NOTE — Patient Instructions (Addendum)
Patient received a flu vaccine IM L deltoid, AV, CMA.  Referral to dermatology regarding lesion behind right ear

## 2020-11-06 NOTE — Progress Notes (Signed)
   Subjective:    Patient ID: Matthew Huff, male    DOB: October 14, 1961, 59 y.o.   MRN: [id number]  HPI 59 year old Male with history of hypertension seen today for flu vaccine.  He called my attention a lesion behind his right ear that is not irritated but has been there for some time.  It tends to get a bit irritated when he is wearing a mask.    Review of Systems see above     Objective:   Physical Exam Blood pressure excellent 120/70 pulse 74 temperature 98.3 degrees pulse oximetry 98%  He has a small lesion behind right ear that currently is not irritated or infected.  It has a horny appearance.     Assessment & Plan:  Lesion behind right ear-?  Keratosis  Plan: Tends to get irritated with mask wearing but currently not infected or significantly irritated.  Referred to dermatology for removal.  Has been present he says for about 1-1/2 years.  Make sure this is not a carcinoma.  He does have sun exposure.

## 2020-12-13 ENCOUNTER — Other Ambulatory Visit: Payer: Self-pay | Admitting: Internal Medicine

## 2020-12-21 ENCOUNTER — Telehealth: Payer: Self-pay | Admitting: Internal Medicine

## 2020-12-21 ENCOUNTER — Encounter: Payer: Self-pay | Admitting: Internal Medicine

## 2020-12-21 MED ORDER — AZITHROMYCIN 250 MG PO TABS: ORAL_TABLET | ORAL | 0 refills | Status: DC

## 2020-12-21 NOTE — Telephone Encounter (Signed)
Patient called saying he took a home Covid test that was positive. He had onset of fatigue and malaise on Friday and has cold type symptoms with thick postnasal drip and low grade fever. No vomiting. Staying well hydrated. Must quarantine for at least 5 days from onset of symptoms. Likely exposed from co-workers who  also have had Covid. Will send in Zithromax Z-pak today. He may call if he has further questions or concerns.

## 2020-12-23 ENCOUNTER — Other Ambulatory Visit: Payer: Self-pay | Admitting: Internal Medicine

## 2021-01-05 ENCOUNTER — Other Ambulatory Visit: Payer: BC Managed Care – PPO | Admitting: Internal Medicine

## 2021-01-05 ENCOUNTER — Other Ambulatory Visit: Payer: Self-pay

## 2021-01-05 DIAGNOSIS — I10 Essential (primary) hypertension: Secondary | ICD-10-CM

## 2021-01-05 DIAGNOSIS — Z6830 Body mass index (BMI) 30.0-30.9, adult: Secondary | ICD-10-CM

## 2021-01-05 DIAGNOSIS — M7918 Myalgia, other site: Secondary | ICD-10-CM

## 2021-01-05 DIAGNOSIS — Z0000 Encounter for general adult medical examination without abnormal findings: Secondary | ICD-10-CM

## 2021-01-05 DIAGNOSIS — E7800 Pure hypercholesterolemia, unspecified: Secondary | ICD-10-CM

## 2021-01-05 DIAGNOSIS — Z125 Encounter for screening for malignant neoplasm of prostate: Secondary | ICD-10-CM

## 2021-01-06 LAB — CBC WITH DIFFERENTIAL/PLATELET
Absolute Monocytes: 670 cells/uL (ref 200–950)
Basophils Absolute: 56 cells/uL (ref 0–200)
Basophils Relative: 0.6 %
Eosinophils Absolute: 28 cells/uL (ref 15–500)
Eosinophils Relative: 0.3 %
HCT: 46.4 % (ref 38.5–50.0)
Hemoglobin: 16.2 g/dL (ref 13.2–17.1)
Lymphs Abs: 1497 cells/uL (ref 850–3900)
MCH: 32.9 pg (ref 27.0–33.0)
MCHC: 34.9 g/dL (ref 32.0–36.0)
MCV: 94.1 fL (ref 80.0–100.0)
MPV: 10.2 fL (ref 7.5–12.5)
Monocytes Relative: 7.2 %
Neutro Abs: 7049 cells/uL (ref 1500–7800)
Neutrophils Relative %: 75.8 %
Platelets: 250 10*3/uL (ref 140–400)
RBC: 4.93 10*6/uL (ref 4.20–5.80)
RDW: 12.4 % (ref 11.0–15.0)
Total Lymphocyte: 16.1 %
WBC: 9.3 10*3/uL (ref 3.8–10.8)

## 2021-01-06 LAB — COMPLETE METABOLIC PANEL WITH GFR
AG Ratio: 2 (calc) (ref 1.0–2.5)
ALT: 18 U/L (ref 9–46)
AST: 15 U/L (ref 10–35)
Albumin: 4.7 g/dL (ref 3.6–5.1)
Alkaline phosphatase (APISO): 45 U/L (ref 35–144)
BUN/Creatinine Ratio: 20 (calc) (ref 6–22)
BUN: 13 mg/dL (ref 7–25)
CO2: 26 mmol/L (ref 20–32)
Calcium: 10.4 mg/dL — ABNORMAL HIGH (ref 8.6–10.3)
Chloride: 105 mmol/L (ref 98–110)
Creat: 0.66 mg/dL — ABNORMAL LOW (ref 0.70–1.33)
GFR, Est African American: 124 mL/min/{1.73_m2} (ref >=60)
GFR, Est Non African American: 107 mL/min/{1.73_m2} (ref >=60)
Globulin: 2.4 g/dL (calc) (ref 1.9–3.7)
Glucose, Bld: 94 mg/dL (ref 65–99)
Potassium: 3.7 mmol/L (ref 3.5–5.3)
Sodium: 141 mmol/L (ref 135–146)
Total Bilirubin: 0.6 mg/dL (ref 0.2–1.2)
Total Protein: 7.1 g/dL (ref 6.1–8.1)

## 2021-01-06 LAB — LIPID PANEL
Cholesterol: 240 mg/dL — ABNORMAL HIGH (ref <200)
HDL: 105 mg/dL (ref >=40)
LDL Cholesterol (Calc): 120 mg/dL (calc) — ABNORMAL HIGH
Non-HDL Cholesterol (Calc): 135 mg/dL (calc) — ABNORMAL HIGH (ref <130)
Total CHOL/HDL Ratio: 2.3 (calc) (ref <5.0)
Triglycerides: 61 mg/dL (ref <150)

## 2021-01-06 LAB — PSA: PSA: 3.52 ng/mL (ref <=4.0)

## 2021-01-08 ENCOUNTER — Encounter: Payer: Self-pay | Admitting: Internal Medicine

## 2021-01-08 ENCOUNTER — Other Ambulatory Visit: Payer: Self-pay

## 2021-01-08 ENCOUNTER — Ambulatory Visit (INDEPENDENT_AMBULATORY_CARE_PROVIDER_SITE_OTHER): Payer: BC Managed Care – PPO | Admitting: Internal Medicine

## 2021-01-08 VITALS — BP 140/80 | HR 90 | Ht 71.0 in | Wt 228.0 lb

## 2021-01-08 DIAGNOSIS — Z6831 Body mass index (BMI) 31.0-31.9, adult: Secondary | ICD-10-CM | POA: Diagnosis not present

## 2021-01-08 DIAGNOSIS — I10 Essential (primary) hypertension: Secondary | ICD-10-CM | POA: Diagnosis not present

## 2021-01-08 DIAGNOSIS — R7989 Other specified abnormal findings of blood chemistry: Secondary | ICD-10-CM

## 2021-01-08 DIAGNOSIS — Z0000 Encounter for general adult medical examination without abnormal findings: Secondary | ICD-10-CM

## 2021-01-08 DIAGNOSIS — E7800 Pure hypercholesterolemia, unspecified: Secondary | ICD-10-CM | POA: Diagnosis not present

## 2021-01-08 LAB — POCT URINALYSIS DIPSTICK
Appearance: NEGATIVE
Bilirubin, UA: NEGATIVE
Blood, UA: NEGATIVE
Glucose, UA: NEGATIVE
Ketones, UA: NEGATIVE
Leukocytes, UA: NEGATIVE
Nitrite, UA: NEGATIVE
Odor: NEGATIVE
Protein, UA: NEGATIVE
Spec Grav, UA: 1.01 (ref 1.010–1.025)
Urobilinogen, UA: 0.2 E.U./dL
pH, UA: 6.5 (ref 5.0–8.0)

## 2021-01-08 NOTE — Progress Notes (Signed)
Subjective:    Patient ID: Matthew Huff, male    DOB: Aug 21, 1961, 60 y.o.   MRN: [id number]  HPI  60 year old Male for health maintenance exam and evaluation of medical issues.  He has a history of essential hypertension and obesity.  History of low back pain without sciatica in 2018.  He had a back injury in his 20s lasting some 8 days.  History of left shoulder arthropathy injected by [facility] in 2016.  Takes Mobic for musculoskeletal pain.  History of left knee pain seen by Dr. Marigene Ehlers February 2018.  Report indicated he likely had a meniscal tear.  This occurred at work when he slipped.  History of fractured right arm at age 47.  He had mild COVID 19 illness in January 2022.  He quarantined at home and did well.  Likely was exposed from coworkers and also had Covid.  Completely recovered without sequela.  He had had his third COVID vaccine about 10 days before he came down with illness.  History of fractured ankle at age 40.  No known drug allergies.  History of hyperlipidemia but once he lost weight, he was able to come off of Lipitor.  He has a history of office hypertension.  Colonoscopy discussed.  Social history: He is divorced.  1 adult daughter who is married.  [place] [address] and formerly lived in [place].  Patient resides alone.  Enjoys fishing.  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  Social alcohol consumption.  Family history: Mother with history of diabetes and coronary artery disease died of congestive heart failure complications.  Father deceased with history of hypertension, atherosclerotic cardiovascular disease and dementia.  1 sister overweight.    Review of Systems  Respiratory: Negative.   Cardiovascular: Negative.   Gastrointestinal: Negative.   Genitourinary: Negative.   Neurological: Negative.   Psychiatric/Behavioral: Negative.        Objective:   Physical  Exam Vitals reviewed.  Constitutional:      Appearance: Normal appearance.  HENT:     Head: Normocephalic.     Right Ear: Tympanic membrane normal.     Left Ear: Tympanic membrane normal.     Nose: Nose normal.  Eyes:     General: No scleral icterus.       Right eye: No discharge.        Left eye: No discharge.     Extraocular Movements: Extraocular movements intact.     Pupils: Pupils are equal, round, and reactive to light.  Neck:     Vascular: No carotid bruit.  Cardiovascular:     Rate and Rhythm: Normal rate.     Pulses: Normal pulses.     Heart sounds: Normal heart sounds. No murmur heard.   Pulmonary:     Effort: Pulmonary effort is normal.     Breath sounds: Normal breath sounds. No wheezing or rales.  Abdominal:     General: Bowel sounds are normal.     Palpations: Abdomen is soft.  Genitourinary:    Prostate: Normal.  Musculoskeletal:     Cervical back: Neck supple.     Right lower leg: No edema.     Left lower leg: No edema.  Lymphadenopathy:     Cervical: No cervical adenopathy.  Skin:    General: Skin is warm and dry.     Findings: No rash.  Neurological:     General: No focal  deficit present.     Mental Status: He is alert and oriented to person, place, and time.     Cranial Nerves: No cranial nerve deficit.     Motor: No weakness.     Coordination: Coordination normal.     Gait: Gait normal.     Deep Tendon Reflexes: Reflexes normal.  Psychiatric:        Mood and Affect: Mood normal.        Behavior: Behavior normal.        Thought Content: Thought content normal.        Judgment: Judgment normal.           Assessment & Plan:  Essential hypertension treated with losartan, HCTZ and amlodipine.  History of office hypertension.  Blood pressure is stable today.  He gets better results at home.  BMI 31.80.  Weight last year was 229 pounds.  Encourage diet and more exercise.  Musculoskeletal pain treated with Mobic.  Hyperlipidemia: Total  cholesterol 240 and was 228 when checked at last visit.  HDL is excellent at 105.  Triglycerides normal at 61.  LDL cholesterol 120.  He has high HDL which makes his total cholesterol look a bit higher but his LDL has increased from 101 when last checked to 120.  Encourage diet exercise and weight loss.  Health maintenance-colonoscopy discussed PSA is normal.  History of COVID-19-January 2022-mild illness  Plan: He will continue with current medications and follow-up in 6 months for office visit, blood pressure check, lipid panel and liver functions.  He will try to get a bit more exercise and watch his diet.

## 2021-02-15 NOTE — Patient Instructions (Signed)
It was a pleasure to see you today.  Continue current medications.  Try to lose weight and get a bit more exercise.  Follow-up in 6 months.

## 2021-05-21 DIAGNOSIS — L820 Inflamed seborrheic keratosis: Secondary | ICD-10-CM | POA: Diagnosis not present

## 2021-05-21 DIAGNOSIS — L2089 Other atopic dermatitis: Secondary | ICD-10-CM | POA: Diagnosis not present

## 2021-05-21 DIAGNOSIS — L821 Other seborrheic keratosis: Secondary | ICD-10-CM | POA: Diagnosis not present

## 2021-05-21 DIAGNOSIS — D225 Melanocytic nevi of trunk: Secondary | ICD-10-CM | POA: Diagnosis not present

## 2021-07-06 ENCOUNTER — Ambulatory Visit: Payer: BC Managed Care – PPO | Admitting: Internal Medicine

## 2021-07-17 DIAGNOSIS — L820 Inflamed seborrheic keratosis: Secondary | ICD-10-CM | POA: Diagnosis not present

## 2021-08-03 ENCOUNTER — Ambulatory Visit: Payer: BC Managed Care – PPO | Admitting: Internal Medicine

## 2021-08-03 ENCOUNTER — Other Ambulatory Visit: Payer: Self-pay

## 2021-08-03 ENCOUNTER — Encounter: Payer: Self-pay | Admitting: Internal Medicine

## 2021-08-03 VITALS — BP 130/80 | HR 82 | Wt 227.5 lb

## 2021-08-03 DIAGNOSIS — E7800 Pure hypercholesterolemia, unspecified: Secondary | ICD-10-CM | POA: Diagnosis not present

## 2021-08-03 DIAGNOSIS — Z6831 Body mass index (BMI) 31.0-31.9, adult: Secondary | ICD-10-CM | POA: Diagnosis not present

## 2021-08-03 DIAGNOSIS — I10 Essential (primary) hypertension: Secondary | ICD-10-CM | POA: Diagnosis not present

## 2021-08-03 NOTE — Progress Notes (Signed)
   Subjective:    Patient ID: Matthew Huff, male    DOB: 1961-10-14, 60 y.o.   MRN: [id number]  HPI 60 year old Male seen for 6 month recheck. He has hypertension and hyperlipidemia.  Lipids not checked with this visit.  In January total cholesterol was 240 and LDL cholesterol 120.  HDL was 105 and triglycerides were 61.  Weight is 227.5 pounds.  BMI 31.73.  Would like to see him to be a bit more physically active after work.   Review of Systems daughter [place]. Has a neighbor he is friends with.     Objective:   Physical Exam BMI 31.73 blood pressure 130/80 pulse 82 regular weight 227 pounds.  BMI 31.73  Skin: Warm and dry.  No cervical adenopathy, thyromegaly or carotid bruits.  Chest clear to auscultation.  Cardiac exam: Regular rate and rhythm.         Assessment & Plan:  History of pure hypercholesterolemia-has not wanted to be on statin medication.  Need to consider cardiology evaluation/calcium scoring for him.  Father had history of heart issues.  Will discuss at next visit.  His physical exam is due in February.  Blood pressure under good control on current regimen of losartan HCTZ 100/25 daily and Norvasc 5 mg daily.  Health maintenance due for tetanus immunization update in 2023.  Need to consider COVID booster this fall as well as flu vaccine.

## 2021-08-30 NOTE — Patient Instructions (Signed)
It was a pleasure to see you today.  Please consider COVID-vaccine this fall and flu vaccine.  Continue current medications for hypertension.  Try to get a bit more exercise outside of work and lose some weight.  Follow-up in February 2023 for health maintenance exam and fasting labs.

## 2021-09-23 ENCOUNTER — Other Ambulatory Visit: Payer: Self-pay

## 2021-09-23 ENCOUNTER — Ambulatory Visit (INDEPENDENT_AMBULATORY_CARE_PROVIDER_SITE_OTHER): Payer: BC Managed Care – PPO | Admitting: Internal Medicine

## 2021-09-23 DIAGNOSIS — Z23 Encounter for immunization: Secondary | ICD-10-CM | POA: Diagnosis not present

## 2021-09-23 NOTE — Progress Notes (Signed)
Flu shot given in left delt. No reactions.

## 2021-09-25 ENCOUNTER — Other Ambulatory Visit: Payer: Self-pay | Admitting: Internal Medicine

## 2021-11-02 ENCOUNTER — Telehealth: Payer: Self-pay | Admitting: Internal Medicine

## 2021-11-02 ENCOUNTER — Ambulatory Visit (INDEPENDENT_AMBULATORY_CARE_PROVIDER_SITE_OTHER): Payer: BC Managed Care – PPO | Admitting: Internal Medicine

## 2021-11-02 ENCOUNTER — Ambulatory Visit
Admission: RE | Admit: 2021-11-02 | Discharge: 2021-11-02 | Disposition: A | Discharge status: Home / Self Care | Payer: BC Managed Care – PPO | Source: Ambulatory Visit | Attending: Internal Medicine | Admitting: Internal Medicine

## 2021-11-02 ENCOUNTER — Other Ambulatory Visit: Payer: Self-pay

## 2021-11-02 ENCOUNTER — Telehealth: Payer: Self-pay

## 2021-11-02 ENCOUNTER — Ambulatory Visit: Payer: BC Managed Care – PPO | Admitting: Internal Medicine

## 2021-11-02 ENCOUNTER — Other Ambulatory Visit: Payer: Self-pay | Admitting: Internal Medicine

## 2021-11-02 VITALS — Temp 98.7°F

## 2021-11-02 DIAGNOSIS — Z8701 Personal history of pneumonia (recurrent): Secondary | ICD-10-CM | POA: Diagnosis not present

## 2021-11-02 DIAGNOSIS — R0989 Other specified symptoms and signs involving the circulatory and respiratory systems: Secondary | ICD-10-CM

## 2021-11-02 DIAGNOSIS — R059 Cough, unspecified: Secondary | ICD-10-CM | POA: Diagnosis not present

## 2021-11-02 DIAGNOSIS — J988 Other specified respiratory disorders: Secondary | ICD-10-CM

## 2021-11-02 DIAGNOSIS — J189 Pneumonia, unspecified organism: Secondary | ICD-10-CM

## 2021-11-02 DIAGNOSIS — R5383 Other fatigue: Secondary | ICD-10-CM | POA: Diagnosis not present

## 2021-11-02 DIAGNOSIS — B9789 Other viral agents as the cause of diseases classified elsewhere: Secondary | ICD-10-CM

## 2021-11-02 MED ORDER — CEFTRIAXONE SODIUM 1 G IJ SOLR
1.0000 g | Freq: Once | INTRAMUSCULAR | Status: AC
  Administered 2021-11-02: 13:00:00 1 g via INTRAMUSCULAR

## 2021-11-02 MED ORDER — DOXYCYCLINE HYCLATE 100 MG PO TABS: 100.0000 mg | ORAL_TABLET | Freq: Two times a day (BID) | ORAL | 0 refills | Status: DC

## 2021-11-02 MED ORDER — HYDROCODONE BIT-HOMATROP MBR 5-1.5 MG/5ML PO SOLN: 5.0000 mL | Freq: Three times a day (TID) | ORAL | 0 refills | Status: DC | PRN

## 2021-11-02 NOTE — Telephone Encounter (Signed)
Covid negative. He has a car visit at 1230 today.

## 2021-11-02 NOTE — Telephone Encounter (Signed)
Patient called asking Dr Lenord Fellers to call him. He is fighting the crud. Started feeling bad last Tuesday , felt better Wednesday but Thursday felt bad again. Cough and congestion in bronchial tubes, runny nose, achey but denies fever and sore throat. He feels like he is improving but wants to touch base with Dr Lenord Fellers.

## 2021-11-02 NOTE — Telephone Encounter (Signed)
Called patient. CXR is negative even though rales are heard LLL. Out of work at least 2 full days.

## 2021-11-02 NOTE — Telephone Encounter (Signed)
Called ED and ask to do COVID test and call back, he was exposed to Flu at work. Will schedule Car visit if neg for COVID

## 2021-11-02 NOTE — Telephone Encounter (Signed)
edit

## 2021-11-04 ENCOUNTER — Encounter: Payer: Self-pay | Admitting: Internal Medicine

## 2021-11-04 NOTE — Progress Notes (Signed)
   Subjective:    Patient ID: Matthew Huff, male    DOB: June 14, 1961, 60 y.o.   MRN: [id number]  HPI 60 year old Male in today with cough and congestion.  Tested at home for COVID and result was negative.  He has had at least 3 COVID immunizations.  Had flu vaccine in October 2022.  Started with symptoms with malaise and fatigue on Tuesday, November 22.  Has had some myalgias and rhinorrhea.  Denies fever and sore throat.    Review of Systems see above-no nausea vomiting or diarrhea     Objective:   Physical Exam Skin is warm and dry. TMs and chest are clear.  Neck is supple.  Chest: Has coarse breath sounds in right lower lobe.  He looks fatigued      Assessment & Plan:  Acute viral respiratory infection.  Pneumonia not detected on chest x-ray  Plan: He is to be out of work until feeling better. Doxycycline 100 mg twice daily for 10 days.  Hycodan 1 teaspoon every 8 hours as needed for cough.  Rest and drink fluids.  1 g IM Rocephin given

## 2021-11-04 NOTE — Patient Instructions (Signed)
Take doxycycline 100 mg twice daily for 10 days.  Hycodan 1 teaspoon every 8 hours as needed for cough.  Rest and drink fluids.  Given 1 g IM Rocephin.

## 2022-01-11 ENCOUNTER — Other Ambulatory Visit: Payer: BC Managed Care – PPO | Admitting: Internal Medicine

## 2022-01-11 ENCOUNTER — Other Ambulatory Visit: Payer: Self-pay

## 2022-01-11 DIAGNOSIS — Z125 Encounter for screening for malignant neoplasm of prostate: Secondary | ICD-10-CM | POA: Diagnosis not present

## 2022-01-11 DIAGNOSIS — I10 Essential (primary) hypertension: Secondary | ICD-10-CM

## 2022-01-11 DIAGNOSIS — R7989 Other specified abnormal findings of blood chemistry: Secondary | ICD-10-CM

## 2022-01-11 LAB — CBC WITH DIFFERENTIAL/PLATELET
Absolute Monocytes: 708 cells/uL (ref 200–950)
Basophils Absolute: 54 cells/uL (ref 0–200)
Basophils Relative: 0.7 %
Eosinophils Absolute: 23 cells/uL (ref 15–500)
Eosinophils Relative: 0.3 %
HCT: 45.1 % (ref 38.5–50.0)
Hemoglobin: 15.7 g/dL (ref 13.2–17.1)
Lymphs Abs: 1240 cells/uL (ref 850–3900)
MCH: 32.8 pg (ref 27.0–33.0)
MCHC: 34.8 g/dL (ref 32.0–36.0)
MCV: 94.2 fL (ref 80.0–100.0)
MPV: 10.2 fL (ref 7.5–12.5)
Monocytes Relative: 9.2 %
Neutro Abs: 5675 cells/uL (ref 1500–7800)
Neutrophils Relative %: 73.7 %
Platelets: 228 10*3/uL (ref 140–400)
RBC: 4.79 10*6/uL (ref 4.20–5.80)
RDW: 12.3 % (ref 11.0–15.0)
Total Lymphocyte: 16.1 %
WBC: 7.7 10*3/uL (ref 3.8–10.8)

## 2022-01-11 LAB — LIPID PANEL
Cholesterol: 237 mg/dL — ABNORMAL HIGH (ref <200)
HDL: 121 mg/dL (ref >=40)
LDL Cholesterol (Calc): 103 mg/dL (calc) — ABNORMAL HIGH
Non-HDL Cholesterol (Calc): 116 mg/dL (calc) (ref <130)
Total CHOL/HDL Ratio: 2 (calc) (ref <5.0)
Triglycerides: 52 mg/dL (ref <150)

## 2022-01-11 LAB — COMPLETE METABOLIC PANEL WITH GFR
AG Ratio: 2 (calc) (ref 1.0–2.5)
ALT: 20 U/L (ref 9–46)
AST: 19 U/L (ref 10–35)
Albumin: 4.7 g/dL (ref 3.6–5.1)
Alkaline phosphatase (APISO): 45 U/L (ref 35–144)
BUN: 10 mg/dL (ref 7–25)
CO2: 28 mmol/L (ref 20–32)
Calcium: 9.5 mg/dL (ref 8.6–10.3)
Chloride: 101 mmol/L (ref 98–110)
Creat: 0.7 mg/dL (ref 0.70–1.30)
Globulin: 2.3 g/dL (calc) (ref 1.9–3.7)
Glucose, Bld: 89 mg/dL (ref 65–99)
Potassium: 3.7 mmol/L (ref 3.5–5.3)
Sodium: 140 mmol/L (ref 135–146)
Total Bilirubin: 0.7 mg/dL (ref 0.2–1.2)
Total Protein: 7 g/dL (ref 6.1–8.1)
eGFR: 106 mL/min/{1.73_m2} (ref >=60)

## 2022-01-11 LAB — PSA: PSA: 2.87 ng/mL (ref <=4.00)

## 2022-01-12 ENCOUNTER — Encounter: Payer: Self-pay | Admitting: Internal Medicine

## 2022-01-12 ENCOUNTER — Ambulatory Visit (INDEPENDENT_AMBULATORY_CARE_PROVIDER_SITE_OTHER): Payer: BC Managed Care – PPO | Admitting: Internal Medicine

## 2022-01-12 VITALS — BP 150/98 | HR 90 | Temp 98.4°F | Ht 69.25 in | Wt 230.5 lb

## 2022-01-12 DIAGNOSIS — Z6833 Body mass index (BMI) 33.0-33.9, adult: Secondary | ICD-10-CM | POA: Diagnosis not present

## 2022-01-12 DIAGNOSIS — I10 Essential (primary) hypertension: Secondary | ICD-10-CM

## 2022-01-12 DIAGNOSIS — Z0000 Encounter for general adult medical examination without abnormal findings: Secondary | ICD-10-CM | POA: Diagnosis not present

## 2022-01-12 DIAGNOSIS — R7989 Other specified abnormal findings of blood chemistry: Secondary | ICD-10-CM

## 2022-01-12 LAB — POCT URINALYSIS DIPSTICK
Bilirubin, UA: NEGATIVE
Blood, UA: NEGATIVE
Glucose, UA: NEGATIVE
Ketones, UA: NEGATIVE
Leukocytes, UA: NEGATIVE
Nitrite, UA: NEGATIVE
Protein, UA: NEGATIVE
Spec Grav, UA: 1.01 (ref 1.010–1.025)
Urobilinogen, UA: 0.2 E.U./dL
pH, UA: 6.5 (ref 5.0–8.0)

## 2022-01-12 NOTE — Progress Notes (Unsigned)
° °  Subjective:    Patient ID: Matthew Huff, male    DOB: 03-19-1961, 61 y.o.   MRN: [id number]  HPI 61 year old Male for health maintenance exam and evaluation of medical issues.    Review of Systems     Objective:   Physical Exam        Assessment & Plan:

## 2022-01-12 NOTE — Patient Instructions (Signed)
Reminded about colonoscopy. Labs are stable. Continue diet and exercise regimen. RTC in 6 months.

## 2022-01-20 ENCOUNTER — Telehealth: Payer: Self-pay | Admitting: Internal Medicine

## 2022-01-20 NOTE — Telephone Encounter (Signed)
Matthew Huff [phone]  Matthew called to say his blood pressure has been elevated some of his readings were 130/87 and 144/91. I let him know we would need more readings than 2, that Dr Lenord Fellers likes 2 weeks of recordings. He will look at his machine and call me back tomorrow with more readings. He is going out of town this weekend.

## 2022-01-20 NOTE — Telephone Encounter (Signed)
After talking with Dr Lenord Fellers she wants at least a week of recordings and she will not change medication with him going out of town.

## 2022-01-21 NOTE — Telephone Encounter (Signed)
Patient already has refills on file for all medications. I have spoken to the patient and the pharmacy.

## 2022-01-21 NOTE — Telephone Encounter (Signed)
Matthew Huff called back to say his 2 day average of blood pressures has been 137/93 he is leaving Saturday to go visit daughter. I let him know DR Lenord Fellers would not change blood pressure medication with him going out of town. He said he needs a refill. I let him know we need at least a week of a log of his blood pressures taking at least twice a day. Take at least 2 hours after taking blood pressures.

## 2022-01-21 NOTE — Telephone Encounter (Signed)
Scheduled, patient understands he is to bring log of blood pressure readings

## 2022-01-29 ENCOUNTER — Other Ambulatory Visit: Payer: Self-pay

## 2022-01-29 ENCOUNTER — Ambulatory Visit: Payer: BC Managed Care – PPO | Admitting: Internal Medicine

## 2022-01-29 ENCOUNTER — Encounter: Payer: Self-pay | Admitting: Internal Medicine

## 2022-01-29 VITALS — BP 132/78 | HR 86 | Temp 97.5°F | Ht 69.25 in | Wt 230.0 lb

## 2022-01-29 DIAGNOSIS — I10 Essential (primary) hypertension: Secondary | ICD-10-CM

## 2022-01-29 DIAGNOSIS — Z6833 Body mass index (BMI) 33.0-33.9, adult: Secondary | ICD-10-CM

## 2022-01-29 NOTE — Progress Notes (Signed)
° °  Subjective:    Patient ID: Edmund Whelihan, male    DOB: 07-Jan-1961, 61 y.o.   MRN: [id number]  HPI 61 year old Male seen regarding BP management. Not really exercising outside of work very much. Used to like hiking. Did walk some in the mountains last weekend and help load daughter's [employer] sized truck to take to a quilting show. Not much social life outside of work.Longstanding history of HTN.  Patient has been maintained amlodipine 5 mg daily and losartan HCTZ 100/25 daily for some time.  He also takes Mobic 15 mg daily as needed for musculoskeletal pain.  Discussed blood pressure control strategies such as olmesartan instead of losartan.    Review of Systems     Objective:   Physical Exam His blood pressure today is pretty good at 132/78 pulse 86 pulse oximetry 97% weight 230 pounds BMI 33.72 Chest clear.  Cardiac exam: Regular rate and rhythm.      Assessment & Plan:  Elevated BMI of 33.72 this likely a contributing factor to blood pressure not being as well-controlled as it should be.  When he was able to lose weight previously his blood pressure improved dramatically.  1 strategy would be to change losartan to olmesartan.  He would like some time to develop diet and exercise regimen and follow-up in a month or so.

## 2022-01-29 NOTE — Patient Instructions (Signed)
Please work on diet and exercise regimen.  Try to lose some weight.  Continue current blood pressure medications and follow-up in 4 weeks.  We can always change losartan to olmesartan for better blood pressure control.

## 2022-02-26 ENCOUNTER — Ambulatory Visit: Payer: BC Managed Care – PPO | Admitting: Internal Medicine

## 2022-02-26 ENCOUNTER — Other Ambulatory Visit: Payer: Self-pay

## 2022-02-26 ENCOUNTER — Encounter: Payer: Self-pay | Admitting: Internal Medicine

## 2022-02-26 VITALS — BP 132/82 | HR 84 | Temp 98.3°F | Ht 69.25 in | Wt 227.0 lb

## 2022-02-26 DIAGNOSIS — I10 Essential (primary) hypertension: Secondary | ICD-10-CM

## 2022-02-26 DIAGNOSIS — Z6833 Body mass index (BMI) 33.0-33.9, adult: Secondary | ICD-10-CM

## 2022-02-26 NOTE — Progress Notes (Signed)
? ?  Subjective:  ? ? Patient ID: Matthew Huff, male    DOB: 08-07-1961, 61 y.o.   MRN: [id number] ? ?HPI  Brings in multiple BP readings for follow-up on hypertension.  He has been trying to walk some.  Mostly blood pressures are quite good and range from 10 7-1 38 systolic and 77-88 diastolic.  Mostly diastolics range between 77 and 83. ? ?I think he can continue to work on diet exercise and weight loss. ? ?He feels well with no new complaints. ? ?We talked about diet. ? 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 ? ? ? ?Review of Systems see above ? ?   ?Objective:  ? Physical Exam ? ? BP readings reviewed with patient in detail.  He was not examined today. ? ?Weight 227 pounds BMI 33.28 blood pressure today 132/82 pulse 84 and regular pulse oximetry 97% on room air.  He is afebrile ? ? ?   ?Assessment & Plan:  ?Essential hypertension ? ?BMI 33.28 ? ?Plan: He was advised to watch his diet and to get more exercise outside of work. ? ?

## 2022-02-26 NOTE — Patient Instructions (Signed)
Continue to work on diet and exercise. 6 month follow up due August. No change in meds now. ?

## 2022-05-25 ENCOUNTER — Encounter: Payer: Self-pay | Admitting: Internal Medicine

## 2022-05-25 ENCOUNTER — Ambulatory Visit (INDEPENDENT_AMBULATORY_CARE_PROVIDER_SITE_OTHER): Payer: BC Managed Care – PPO | Admitting: Internal Medicine

## 2022-05-25 VITALS — BP 168/100 | HR 78 | Temp 97.7°F

## 2022-05-25 DIAGNOSIS — S8001XA Contusion of right knee, initial encounter: Secondary | ICD-10-CM | POA: Diagnosis not present

## 2022-05-25 NOTE — Progress Notes (Signed)
   Subjective:    Patient ID: Matthew Huff, male    DOB: 03/26/1961, 61 y.o.   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 He resides alone. He came into his house several days ago wearing crocs after working outside. His shoes were slippery and he took a hard fall and struck furniture and the floor No LOC- did not strike head. His right knee struck hard and began to have immediate swelling that has not subsided. He worked several days on his feet for 8 hours last week. He stayed off of leg this weekend but it did not improve much. His daughter who is a Engineer, civil (consulting) is concerned. He has had football injury to right knee in the remote past.    Review of Systems see above     Objective:   Physical Exam  Quadriceps inhibition right thigh. No joint line tenderness. Has no significant knee effusion. 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 Has to stand to work about all day.Has written him out of work until he can see Orthopedist to rule out internal derangement of knee.May ice knee 20 minutes 2-3 times daily. Referral to [facility].

## 2022-05-28 DIAGNOSIS — M25561 Pain in right knee: Secondary | ICD-10-CM | POA: Diagnosis not present

## 2022-05-28 DIAGNOSIS — M79661 Pain in right lower leg: Secondary | ICD-10-CM | POA: Diagnosis not present

## 2022-06-02 NOTE — Patient Instructions (Signed)
Ice knee 20 minutes 2-3 times daily. Take Mobic 15 mg daily. Has upcoming appt with [facility].

## 2022-06-18 DIAGNOSIS — M25561 Pain in right knee: Secondary | ICD-10-CM | POA: Diagnosis not present

## 2022-07-09 ENCOUNTER — Other Ambulatory Visit: Payer: BC Managed Care – PPO

## 2022-07-09 DIAGNOSIS — E7800 Pure hypercholesterolemia, unspecified: Secondary | ICD-10-CM | POA: Diagnosis not present

## 2022-07-09 LAB — LIPID PANEL
Cholesterol: 242 mg/dL — ABNORMAL HIGH (ref <200)
HDL: 136 mg/dL (ref >=40)
LDL Cholesterol (Calc): 93 mg/dL (calc)
Non-HDL Cholesterol (Calc): 106 mg/dL (calc) (ref <130)
Total CHOL/HDL Ratio: 1.8 (calc) (ref <5.0)
Triglycerides: 52 mg/dL (ref <150)

## 2022-07-13 ENCOUNTER — Encounter: Payer: Self-pay | Admitting: Internal Medicine

## 2022-07-13 ENCOUNTER — Ambulatory Visit: Payer: BC Managed Care – PPO | Admitting: Internal Medicine

## 2022-07-13 ENCOUNTER — Other Ambulatory Visit: Payer: Self-pay | Admitting: Internal Medicine

## 2022-07-13 VITALS — BP 144/76 | HR 84 | Temp 98.8°F | Wt 227.8 lb

## 2022-07-13 DIAGNOSIS — R7989 Other specified abnormal findings of blood chemistry: Secondary | ICD-10-CM | POA: Diagnosis not present

## 2022-07-13 DIAGNOSIS — Z6833 Body mass index (BMI) 33.0-33.9, adult: Secondary | ICD-10-CM | POA: Diagnosis not present

## 2022-07-13 DIAGNOSIS — I10 Essential (primary) hypertension: Secondary | ICD-10-CM

## 2022-07-13 NOTE — Progress Notes (Signed)
   Subjective:    Patient ID: Matthew Huff, male    DOB: 06/08/1961, 61 y.o.   MRN: [id number]  HPI 61 year old Male for 6 month follow up. His BP is elevated at 144/76. He is to keep an eye on this and let me know if persistently elevated. His BMI is 33.79. Needs to lose weight. Knee contusion from June  is improving. Saw orthopedist.  His lipid panel is excellent. He has a high HDL of 136, triglycerides of 52 and LDL of 93. These excellent values make his total cholesterol high at 242 but it is a good balance and not concerning.  He is on losartan/HCTZ 100/25 daily and amlodipine 5 mg daily.  Today have prescribed meloxicam 15 mg daily for musculoskeletal pain.   Review of Systems no new complaints     Objective:   Physical Exam Recheck BP is  140/70 Chest clear to auscultation. Cor RRR without ectopy. No LE edema.      Assessment & Plan:  Elevated blood pressure reading-she is to continue to monitor his blood pressure on a regular basis and let me know if persistently elevated.  We may need to change to a stronger ARB such as Benicar instead of losartan.  We will continue with current regimen for now.  I do believe if he could lose some weight his blood pressure would improve.  He may take meloxicam sparingly for musculoskeletal pain.  His lipid panel is excellent.  He does not require statin.  Patient needs tetanus immunization updated November.  I recommend COVID booster this Fall and flu vaccine.

## 2022-08-21 NOTE — Patient Instructions (Addendum)
It was a pleasure to see you today.  Continue to work on diet, weight loss, and exercise.  Continue current medications.  Your lipid panel is excellent.  Immunizations discussed for the Fall.  Complete physical exam and fasting labs due February 2024.  I recommend a COVID booster this Fall and flu vaccine.  He will also need tetanus immunization updated in November.

## 2022-10-18 ENCOUNTER — Other Ambulatory Visit: Payer: Self-pay

## 2022-10-18 MED ORDER — AMLODIPINE BESYLATE 5 MG PO TABS: ORAL_TABLET | ORAL | 3 refills | Status: DC

## 2022-10-18 MED ORDER — LOSARTAN POTASSIUM-HCTZ 100-25 MG PO TABS: 1.0000 | ORAL_TABLET | Freq: Every day | ORAL | 3 refills | Status: DC

## 2022-10-26 ENCOUNTER — Ambulatory Visit: Payer: BC Managed Care – PPO | Admitting: Internal Medicine

## 2022-10-26 VITALS — BP 130/74 | HR 93 | Temp 98.5°F

## 2022-10-26 DIAGNOSIS — Z7185 Encounter for immunization safety counseling: Secondary | ICD-10-CM

## 2022-10-26 DIAGNOSIS — L03012 Cellulitis of left finger: Secondary | ICD-10-CM

## 2022-10-26 DIAGNOSIS — Z23 Encounter for immunization: Secondary | ICD-10-CM

## 2022-10-26 MED ORDER — DOXYCYCLINE HYCLATE 100 MG PO TABS: 100.0000 mg | ORAL_TABLET | Freq: Two times a day (BID) | ORAL | 0 refills | Status: DC

## 2022-10-26 MED ORDER — MUPIROCIN 2 % EX OINT: 1.0000 | TOPICAL_OINTMENT | Freq: Two times a day (BID) | CUTANEOUS | 1 refills | Status: DC

## 2022-10-26 NOTE — Progress Notes (Signed)
   Subjective:    Patient ID: Matthew Huff, male    DOB: July 28, 1961, 61 y.o.   MRN: [id number]  HPI He came originally for a flu vaccine today but has developed a paronychia left fourth finger.  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  Thinks he caught finger in some auto part.  History of hypertension treated with losartan/HCTZ and amlodipine    Review of Systems no fever, chills, nausea, vomiting or headache     Objective:   Physical Exam Blood pressure stable at 130/74 pulse 93 temperature 98.5 degrees he has paronychia left fourth finger without drainage at the present time.  Flu vaccine given       Assessment & Plan:  Paronychia left fourth finger due to accident while working with auto part.  He is a IT sales professional.  Tetanus immunization update given today.  Started on doxycycline 100 mg twice daily for 7 days.  Soak finger in warm soapy water for 20 minutes twice daily and apply Bactroban ointment twice daily.  Essential hypertension-stable on current regimen of amlodipine and losartan/HCTZ  Musculoskeletal pain treated with Mobic as needed  Need for flu vaccine-given today

## 2022-10-26 NOTE — Patient Instructions (Signed)
Flu vaccine and tetanus immunization update given today.  Take Mobic as needed for musculoskeletal pain.  Blood pressure is stable on amlodipine and losartan/HCTZ.  Tetanus immunization update given due to finger injury.  Started on doxycycline 100 mg twice daily for 7 days.  Soak finger in warm soapy water for 20 minutes twice daily until healed.  Apply Bactroban ointment twice daily.

## 2022-12-28 ENCOUNTER — Encounter: Payer: Self-pay | Admitting: Internal Medicine

## 2022-12-28 ENCOUNTER — Telehealth: Payer: Self-pay | Admitting: Internal Medicine

## 2022-12-28 ENCOUNTER — Telehealth (INDEPENDENT_AMBULATORY_CARE_PROVIDER_SITE_OTHER): Payer: BC Managed Care – PPO | Admitting: Internal Medicine

## 2022-12-28 VITALS — BP 138/86 | Wt 227.0 lb

## 2022-12-28 DIAGNOSIS — U071 COVID-19: Secondary | ICD-10-CM | POA: Diagnosis not present

## 2022-12-28 NOTE — Progress Notes (Addendum)
I connected with [name] on 12/28/22  at 12:27 PM by video enabled telemedicine visit and verified that I am speaking with the correct person using two identifiers.   I discussed the limitations, risks, security and privacy concerns of performing an evaluation and management service by telemedicine and the availability of in-person appointments. I also discussed with the patient that there may be a patient responsible charge related to this service. The patient expressed understanding and agreed to proceed. He is agreeable to visit in in this format today. He is at his home and I am at my office.  Other persons participating in the visit and their role in the encounter: Medical scribe, Eugene Gavia  Patient's location: Home  Provider's location: Clinic   Chief Complaint: COVID-19 positive  Subjective:    Patient ID: Matthew Huff , male    DOB: [birth year], 62 y.o.    MRN: [id number]   62 y.o. male presents today for sick visit.  He states that his throat began to feel scratchy x5 days ago. Patient has since developed accompanying sneezing, fatigue, nasal congestion, and frontal sinus pressure. He reports accompanying chills but has not been able to find a thermometer to document his temperature. He has had a mild cough. He denies any yellow/green sputum production. He is using cough syrup with some relief. He denies any chest congestion, headache, or N/V/D. He tested positive for COVID x3 days ago. He has had COVID 1 other time in the past. He states that he has a 7 day course of Doxycyline 100mg  at home which he was prescribed in November 2023 for a paronychia but never started it.       Family History  Problem Relation Age of Onset   Diabetes Mother    Hypertension Mother    Hyperlipidemia Mother    Heart disease Mother    Hypertension Father    Hyperlipidemia Father    Diverticulitis Father    Stroke Father    Hypertension Sister     Past Medical  History:  Diagnosis Date   HTN (hypertension), benign    Left inguinal hernia 02/2011     Social History   Social History Narrative       Social history: He is divorced.  1 adult daughter who is married.  Has moved to the mountains of [place] and formerly lived in [place].  Patient resides alone.  Enjoys fishing.  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  Social alcohol consumption.       Family history: Mother with history of diabetes and coronary artery disease died of congestive heart failure complications.  Father deceased with history of hypertension, atherosclerotic cardiovascular disease and dementia.  1 sister overweight.            Patient Care Team: Elby Showers, MD as PCP - General (Internal Medicine)   Review of Systems  Constitutional:  Positive for chills and malaise/fatigue. Negative for fever.  HENT:  Positive for congestion and sore throat ("scratchy").        + sinus pressure + sneezing  Eyes:  Negative for blurred vision.  Respiratory:  Positive for cough. Negative for sputum production and shortness of breath.   Cardiovascular:  Negative for chest pain, palpitations and leg swelling.  Gastrointestinal:  Negative for abdominal pain, diarrhea, nausea and vomiting.  Musculoskeletal:  Negative for back pain.  Skin:  Negative for rash.  Neurological:  Negative for loss of consciousness  and headaches.        Objective:   Vitals: BP 138/86   Wt 227 lb (103 kg)   SpO2 97%   BMI 33.28 kg/m    Physical Exam Vitals and nursing note reviewed.  Constitutional:      General: He is awake. He is not in acute distress.    Appearance: Normal appearance. He is not ill-appearing or toxic-appearing.  HENT:     Head: Atraumatic.  Musculoskeletal:     Cervical back: Normal range of motion.  Skin:    General: Skin is warm.  Neurological:     General: No focal deficit present.     Mental Status: He is alert and oriented to  person, place, and time. Mental status is at baseline.  Psychiatric:        Mood and Affect: Mood normal.        Behavior: Behavior normal. Behavior is cooperative.        Thought Content: Thought content normal.        Judgment: Judgment normal.    Respiratory rate normal and not heard to be coughing      Assessment & Plan:   COVID-19: He was advised to use his cough syrup(Hycodan) that he had on hand from previous bout of Covid if needed for cough mitigation. Given persistence of his congestion and cough, I encouraged him to start and complete full 7 day course of Doxycyline 100mg  BID.    I,Alexis Herring,acting as a Education administrator for Elby Showers, MD.,have documented all relevant documentation on the behalf of Elby Showers, MD,as directed by  Elby Showers, MD while in the presence of Elby Showers, MD.   I, Elby Showers, MD, have reviewed all documentation for this visit. The documentation on 12/28/22 for the exam, diagnosis, procedures, and orders are all accurate and complete.

## 2022-12-28 NOTE — Patient Instructions (Addendum)
We discussed Paxlovid treatment but patient feels he is not ill enough for that. Says he has flu-like symptoms but no respiratory distress but productive cough. He will stay well hydrated and walk some to prevent atelectasis. He will take Hycodan one tsp every 8 hours as needed for cough and take antibiotics he has on hand Doxycycline 100 mg twice daily for 7 days. Out of work 5 days. Call if not improving or has any concerns whatsoever.

## 2022-12-28 NOTE — Telephone Encounter (Signed)
Matthew Huff Salles [phone]  Matthew called to say he test positive for COVID on Sunday, he said it feels like a bad sinus cold, it started with bad sore throat, sneezing, cough, runny nose, tired, and he has a lot of sinus pressure. Scheduled him for video visit at 12:00

## 2023-01-06 ENCOUNTER — Encounter: Payer: Self-pay | Admitting: Internal Medicine

## 2023-01-06 ENCOUNTER — Ambulatory Visit: Payer: BC Managed Care – PPO | Admitting: Internal Medicine

## 2023-01-06 VITALS — BP 136/84 | HR 82 | Temp 98.6°F

## 2023-01-06 DIAGNOSIS — J988 Other specified respiratory disorders: Secondary | ICD-10-CM | POA: Diagnosis not present

## 2023-01-06 DIAGNOSIS — L03012 Cellulitis of left finger: Secondary | ICD-10-CM

## 2023-01-06 DIAGNOSIS — B9789 Other viral agents as the cause of diseases classified elsewhere: Secondary | ICD-10-CM

## 2023-01-06 DIAGNOSIS — I10 Essential (primary) hypertension: Secondary | ICD-10-CM

## 2023-01-06 DIAGNOSIS — R0989 Other specified symptoms and signs involving the circulatory and respiratory systems: Secondary | ICD-10-CM | POA: Diagnosis not present

## 2023-01-06 MED ORDER — METHYLPREDNISOLONE ACETATE 80 MG/ML IJ SUSP
80.0000 mg | Freq: Once | INTRAMUSCULAR | Status: AC
  Administered 2023-01-06: 80 mg via INTRAMUSCULAR

## 2023-01-06 MED ORDER — AZITHROMYCIN 250 MG PO TABS: ORAL_TABLET | ORAL | 0 refills | Status: AC

## 2023-01-06 NOTE — Telephone Encounter (Signed)
scheduled

## 2023-01-06 NOTE — Telephone Encounter (Signed)
Ed called today asking how long this COVID is supposed to last he tested positive on 12/28/2022 and is still having congestion, tired, runny nose, legs are weak, he stayed out of work 6-7 days and is still only able to work half days. Is there any thing else he can take or do. Do you want me to schedule him for video visit?

## 2023-01-06 NOTE — Progress Notes (Signed)
Patient Care Team: Elby Showers, MD as PCP - General (Internal Medicine)  Visit Date: 01/06/23  Subjective:    Patient ID: Matthew Huff , Male   DOB: 01/12/1961, 62 y.o.    MRN: [id number]   62 y.o. Male presents today for fatigue, congestion, runny nose. Patient has a past medical history of hypertension, left inguinal hernia.  He was seen on 12/28/22 for Covid-19. Declined Paxlovid. Treated with Doxycycline and Hydrocodone. Stayed home from work for one week. He is now reporting lingering symptoms of congestion, fatigue, runny nose. He reports a severe flu infection in his mid-20s with accompanying pneumonia.  He notes a persistent cyst on his left middle finger since 10/26/22. He believes he may have pinched his finger while working in the [employer].   History of hypertension treated with HCTZ 100-25 mg daily and Norvasc 5 mg daily.  He has a blood work appointment on 01/13/23.   Past Medical History:  Diagnosis Date   HTN (hypertension), benign    HTN (hypertension), benign    Left inguinal hernia 02/04/2011     Family History  Problem Relation Age of Onset   Diabetes Mother    Hypertension Mother    Hyperlipidemia Mother    Heart disease Mother    Hypertension Father    Hyperlipidemia Father    Diverticulitis Father    Stroke Father    Hypertension Sister     Social History   Social History Narrative       Social history: He is divorced.  1 adult daughter who is married.  Has moved to the mountains of [place] and formerly lived in [place].  Patient resides alone.  Enjoys fishing.  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  Social alcohol consumption.       Family history: Mother with history of diabetes and coronary artery disease died of congestive heart failure complications.  Father deceased with history of hypertension, atherosclerotic cardiovascular disease and dementia.  1 sister overweight.               Review of Systems  Constitutional:  Positive for malaise/fatigue. Negative for fever.  HENT:  Positive for congestion.        (+) Runny nose  Eyes:  Negative for blurred vision.  Respiratory:  Positive for cough. Negative for shortness of breath.   Cardiovascular:  Negative for chest pain, palpitations and leg swelling.  Gastrointestinal:  Negative for vomiting.  Musculoskeletal:  Negative for back pain.  Skin:  Negative for rash.       (+) Left third finger perionychia  Neurological:  Negative for loss of consciousness and headaches.       (+) Bilateral leg numbness        Objective:   Vitals: BP 136/84   Pulse 82   Temp 98.6 F (37 C) (Tympanic)   SpO2 98%    Physical Exam Constitutional:      General: He is not in acute distress.    Appearance: Normal appearance. He is not ill-appearing.  HENT:     Head: Normocephalic and atraumatic.     Right Ear: Tympanic membrane, ear canal and external ear normal.     Left Ear: Tympanic membrane, ear canal and external ear normal.     Mouth/Throat:     Mouth: Mucous membranes are moist.     Pharynx: Oropharynx is clear.  Pulmonary:     Effort: Pulmonary effort is  normal. No respiratory distress.     Breath sounds: Normal breath sounds. No wheezing or rales.  Skin:    General: Skin is warm and dry.  Neurological:     Mental Status: He is alert and oriented to person, place, and time. Mental status is at baseline.  Psychiatric:        Mood and Affect: Mood normal.        Behavior: Behavior normal.        Thought Content: Thought content normal.        Judgment: Judgment normal.     Paronychia left third finger  Results:   Studies obtained and personally reviewed by me:   Labs:       Component Value Date/Time   NA 140 01/11/2022 1123   K 3.7 01/11/2022 1123   CL 101 01/11/2022 1123   CO2 28 01/11/2022 1123   GLUCOSE 89 01/11/2022 1123   BUN 10 01/11/2022 1123   CREATININE 0.70 01/11/2022 1123    CALCIUM 9.5 01/11/2022 1123   PROT 7.0 01/11/2022 1123   ALBUMIN 5.1 06/13/2017 0902   AST 19 01/11/2022 1123   ALT 20 01/11/2022 1123   ALKPHOS 43 06/13/2017 0902   BILITOT 0.7 01/11/2022 1123   GFRNONAA 107 01/05/2021 1134   GFRAA 124 01/05/2021 1134     Lab Results  Component Value Date   WBC 7.7 01/11/2022   HGB 15.7 01/11/2022   HCT 45.1 01/11/2022   MCV 94.2 01/11/2022   PLT 228 01/11/2022    Lab Results  Component Value Date   CHOL 242 (H) 07/09/2022   HDL 136 07/09/2022   LDLCALC 93 07/09/2022   TRIG 52 07/09/2022   CHOLHDL 1.8 07/09/2022    No results found for: "HGBA1C"   No results found for: "TSH"   Lab Results  Component Value Date   PSA 2.87 01/11/2022   PSA 3.52 01/05/2021   PSA 2.7 12/31/2019    Assessment & Plan:   Upper Respiratory Infection: Prescribed Azithromycin 250 mg  2 tabs by mouth day 1 day one followed by one tab days 2-5 days 2-5. Depo medrol 80 mg IM injection administered to help respiratory congestion  Left Third Finger Perionychia: Present since 10/26/22. Referred to hand surgeon.  Hypertension: Well-controlled with  Losartan/HCTZ 100-25 mg daily and amlodipine 5 mg daily.   I,Alexander Ruley,acting as a Education administrator for Elby Showers, MD.,have documented all relevant documentation on the behalf of Elby Showers, MD,as directed by  Elby Showers, MD while in the presence of Elby Showers, MD.   I, Elby Showers, MD, have reviewed all documentation for this visit. The documentation on 01/16/23 for the exam, diagnosis, procedures, and orders are all accurate and complete.

## 2023-01-10 ENCOUNTER — Other Ambulatory Visit: Payer: BC Managed Care – PPO

## 2023-01-12 NOTE — Progress Notes (Deleted)
Patient Care Team: Margaree Mackintosh, MD as PCP - General (Internal Medicine)  Visit Date: 01/14/23  Subjective:    Patient ID: Matthew Huff , Male   DOB: 1961/02/14, 62 y.o.    MRN: [id number]   62 y.o. Male presents today for a comprehensive physical exam. Patient has a past medical history of benign hypertension, left inguinal hernia, hyperlipidemia.  No lingering symptoms from upper respiratory infection treated with Depo 80 mg injection on 01/06/23. Reports did not take Azithromycin.  History of benign hypertension treated with Norvasc 5 mg daily, losartan-HCTZ 25 mg daily. Blood pressure at 128/78 today.  History of hyperlipidemia. CHOL elevated at 215 on 01/13/23.  History of arthritis treated with Mobic 15 mg daily. Reports receiving knee injections in the past.  History of Vitamin D deficiency treated with Vitamin D 1,000 units daily.   PSA normal at 2.48 on 01/13/23, down from 2.87 on 01/11/22.  Colonoscopy discussed. He will schedule the procedure.   Past Medical History:  Diagnosis Date   HTN (hypertension), benign    HTN (hypertension), benign    Left inguinal hernia 02/04/2011     Family History  Problem Relation Age of Onset   Diabetes Mother    Hypertension Mother    Hyperlipidemia Mother    Heart disease Mother    Hypertension Father    Hyperlipidemia Father    Diverticulitis Father    Stroke Father    Hypertension Sister     Social History   Social History Narrative       Social history: He is divorced.  1 adult daughter who is married.  Has [place] the [place] [address] and formerly lived in [place].  Patient resides alone.  Enjoys fishing.  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  Social alcohol consumption.       Family history: Mother with history of diabetes and coronary artery disease died of congestive heart failure complications.  Father deceased with history of hypertension, atherosclerotic  cardiovascular disease and dementia.  1 sister overweight.              Review of Systems  Constitutional:  Negative for chills, fever, malaise/fatigue and weight loss.  HENT:  Negative for hearing loss, sinus pain and sore throat.   Respiratory:  Negative for cough and hemoptysis.   Cardiovascular:  Negative for chest pain, palpitations, leg swelling and PND.  Gastrointestinal:  Negative for abdominal pain, constipation, diarrhea, heartburn, nausea and vomiting.  Genitourinary:  Negative for dysuria, frequency and urgency.  Musculoskeletal:  Negative for back pain, myalgias and neck pain.  Skin:  Negative for itching and rash.  Neurological:  Negative for dizziness, tingling, seizures and headaches.  Endo/Heme/Allergies:  Negative for polydipsia.  Psychiatric/Behavioral:  Negative for depression. The patient is not nervous/anxious.         Objective:   Vitals: BP 128/78   Pulse 80   Temp 98.8 F (37.1 C) (Tympanic)   Ht 5' 9.25" (1.759 m)   Wt 221 lb (100.2 kg)   SpO2 98%   BMI 32.40 kg/m    Physical Exam Vitals and nursing note reviewed. Exam conducted with a chaperone present.  Constitutional:      General: He is awake. He is not in acute distress.    Appearance: Normal appearance. He is not ill-appearing or toxic-appearing.  HENT:     Head: Normocephalic and atraumatic.     Right Ear: Tympanic membrane, ear  canal and external ear normal.     Left Ear: Tympanic membrane, ear canal and external ear normal.     Mouth/Throat:     Pharynx: Oropharynx is clear.  Eyes:     Extraocular Movements: Extraocular movements intact.     Pupils: Pupils are equal, round, and reactive to light.  Neck:     Thyroid: No thyroid mass, thyromegaly or thyroid tenderness.     Vascular: No carotid bruit.  Cardiovascular:     Rate and Rhythm: Normal rate and regular rhythm. No extrasystoles are present.    Pulses: Normal pulses.          Dorsalis pedis pulses are 2+ on the right side  and 2+ on the left side.       Posterior tibial pulses are 2+ on the right side and 2+ on the left side.     Heart sounds: Normal heart sounds. No murmur heard.    No friction rub. No gallop.  Pulmonary:     Effort: Pulmonary effort is normal.     Breath sounds: Normal breath sounds. No decreased breath sounds, wheezing, rhonchi or rales.  Chest:     Chest wall: No mass.  Abdominal:     Palpations: Abdomen is soft.     Tenderness: There is no abdominal tenderness.     Hernia: No hernia is present.  Genitourinary:    Prostate: Normal. Not enlarged and no nodules present.     Comments: Prostate smooth upon examination. Musculoskeletal:     Cervical back: Normal range of motion.     Right lower leg: No edema.     Left lower leg: No edema.  Lymphadenopathy:     Cervical: No cervical adenopathy.     Upper Body:     Right upper body: No supraclavicular adenopathy.     Left upper body: No supraclavicular adenopathy.  Skin:    General: Skin is warm and dry.  Neurological:     General: No focal deficit present.     Mental Status: He is alert and oriented to person, place, and time. Mental status is at baseline.     Cranial Nerves: Cranial nerves 2-12 are intact.     Sensory: Sensation is intact.     Motor: Motor function is intact.     Coordination: Coordination is intact.     Gait: Gait is intact.     Deep Tendon Reflexes: Reflexes are normal and symmetric.  Psychiatric:        Attention and Perception: Attention normal.        Mood and Affect: Mood normal.        Speech: Speech normal.        Behavior: Behavior normal. Behavior is cooperative.        Thought Content: Thought content normal.        Cognition and Memory: Cognition and memory normal.        Judgment: Judgment normal.       Results:   Studies obtained and personally reviewed by me:    Labs:       Component Value Date/Time   NA 139 01/13/2023 0925   K 3.5 01/13/2023 0925   CL 99 01/13/2023 0925   CO2  27 01/13/2023 0925   GLUCOSE 87 01/13/2023 0925   BUN 8 01/13/2023 0925   CREATININE 0.72 01/13/2023 0925   CALCIUM 9.8 01/13/2023 0925   PROT 7.0 01/13/2023 0925   ALBUMIN 5.1 06/13/2017 0902   AST 22  01/13/2023 0925   ALT 22 01/13/2023 0925   ALKPHOS 43 06/13/2017 0902   BILITOT 0.9 01/13/2023 0925   GFRNONAA 107 01/05/2021 1134   GFRAA 124 01/05/2021 1134     Lab Results  Component Value Date   WBC 6.1 01/13/2023   HGB 15.2 01/13/2023   HCT 43.2 01/13/2023   MCV 94.7 01/13/2023   PLT 220 01/13/2023    Lab Results  Component Value Date   CHOL 215 (H) 01/13/2023   HDL 125 01/13/2023   LDLCALC 77 01/13/2023   TRIG 45 01/13/2023   CHOLHDL 1.7 01/13/2023    No results found for: "HGBA1C"   No results found for: "TSH"   Lab Results  Component Value Date   PSA 2.48 01/13/2023   PSA 2.87 01/11/2022   PSA 3.52 01/05/2021      Assessment & Plan:   Hypertension: Well-controlled with Norvasc 5 mg daily, losartan-HCTZ 25 mg daily. Blood pressure at 128/78 today.  Hyperlipidemia: CHOL elevated at 215 on 01/13/23. Advised to improve diet and stay active to lose weight.  Arthritis: Treating with Mobic 15 mg daily. Reports receiving knee injections in the past.   Vitamin D deficiency: Well-controlled with Vitamin D 1,000 units daily.   Colonoscopy discussed. He will be scheduling the procedure.  Vaccine Counseling: Administered pneumococcal 20 vaccine. Declines Covid-19 booster.    I,Alexander Ruley,acting as a Neurosurgeon for Margaree Mackintosh, MD.,have documented all relevant documentation on the behalf of Margaree Mackintosh, MD,as directed by  Margaree Mackintosh, MD while in the presence of Margaree Mackintosh, MD.  I, Margaree Mackintosh, MD, have reviewed all documentation for this visit. The documentation on 01/17/23 for the exam, diagnosis, procedures, and orders are all accurate and complete.

## 2023-01-13 ENCOUNTER — Other Ambulatory Visit: Payer: BC Managed Care – PPO

## 2023-01-13 DIAGNOSIS — E7800 Pure hypercholesterolemia, unspecified: Secondary | ICD-10-CM | POA: Diagnosis not present

## 2023-01-13 DIAGNOSIS — I10 Essential (primary) hypertension: Secondary | ICD-10-CM

## 2023-01-13 DIAGNOSIS — Z125 Encounter for screening for malignant neoplasm of prostate: Secondary | ICD-10-CM | POA: Diagnosis not present

## 2023-01-14 ENCOUNTER — Encounter: Payer: Self-pay | Admitting: Internal Medicine

## 2023-01-14 ENCOUNTER — Ambulatory Visit (INDEPENDENT_AMBULATORY_CARE_PROVIDER_SITE_OTHER): Payer: BC Managed Care – PPO | Admitting: Internal Medicine

## 2023-01-14 VITALS — BP 128/78 | HR 80 | Temp 98.8°F | Ht 69.25 in | Wt 221.0 lb

## 2023-01-14 DIAGNOSIS — R7989 Other specified abnormal findings of blood chemistry: Secondary | ICD-10-CM

## 2023-01-14 DIAGNOSIS — Z0000 Encounter for general adult medical examination without abnormal findings: Secondary | ICD-10-CM

## 2023-01-14 DIAGNOSIS — E7800 Pure hypercholesterolemia, unspecified: Secondary | ICD-10-CM

## 2023-01-14 DIAGNOSIS — Z23 Encounter for immunization: Secondary | ICD-10-CM

## 2023-01-14 DIAGNOSIS — I10 Essential (primary) hypertension: Secondary | ICD-10-CM | POA: Diagnosis not present

## 2023-01-14 DIAGNOSIS — Z6832 Body mass index (BMI) 32.0-32.9, adult: Secondary | ICD-10-CM

## 2023-01-14 LAB — CBC WITH DIFFERENTIAL/PLATELET
Absolute Monocytes: 756 cells/uL (ref 200–950)
Basophils Absolute: 49 cells/uL (ref 0–200)
Basophils Relative: 0.8 %
Eosinophils Absolute: 31 cells/uL (ref 15–500)
Eosinophils Relative: 0.5 %
HCT: 43.2 % (ref 38.5–50.0)
Hemoglobin: 15.2 g/dL (ref 13.2–17.1)
Lymphs Abs: 1110 cells/uL (ref 850–3900)
MCH: 33.3 pg — ABNORMAL HIGH (ref 27.0–33.0)
MCHC: 35.2 g/dL (ref 32.0–36.0)
MCV: 94.7 fL (ref 80.0–100.0)
MPV: 10.2 fL (ref 7.5–12.5)
Monocytes Relative: 12.4 %
Neutro Abs: 4154 cells/uL (ref 1500–7800)
Neutrophils Relative %: 68.1 %
Platelets: 220 10*3/uL (ref 140–400)
RBC: 4.56 10*6/uL (ref 4.20–5.80)
RDW: 12.3 % (ref 11.0–15.0)
Total Lymphocyte: 18.2 %
WBC: 6.1 10*3/uL (ref 3.8–10.8)

## 2023-01-14 LAB — COMPLETE METABOLIC PANEL WITH GFR
AG Ratio: 2.2 (calc) (ref 1.0–2.5)
ALT: 22 U/L (ref 9–46)
AST: 22 U/L (ref 10–35)
Albumin: 4.8 g/dL (ref 3.6–5.1)
Alkaline phosphatase (APISO): 42 U/L (ref 35–144)
BUN: 8 mg/dL (ref 7–25)
CO2: 27 mmol/L (ref 20–32)
Calcium: 9.8 mg/dL (ref 8.6–10.3)
Chloride: 99 mmol/L (ref 98–110)
Creat: 0.72 mg/dL (ref 0.70–1.35)
Globulin: 2.2 g/dL (calc) (ref 1.9–3.7)
Glucose, Bld: 87 mg/dL (ref 65–99)
Potassium: 3.5 mmol/L (ref 3.5–5.3)
Sodium: 139 mmol/L (ref 135–146)
Total Bilirubin: 0.9 mg/dL (ref 0.2–1.2)
Total Protein: 7 g/dL (ref 6.1–8.1)
eGFR: 105 mL/min/{1.73_m2} (ref >=60)

## 2023-01-14 LAB — LIPID PANEL
Cholesterol: 215 mg/dL — ABNORMAL HIGH (ref <200)
HDL: 125 mg/dL (ref >=40)
LDL Cholesterol (Calc): 77 mg/dL (calc)
Non-HDL Cholesterol (Calc): 90 mg/dL (calc) (ref <130)
Total CHOL/HDL Ratio: 1.7 (calc) (ref <5.0)
Triglycerides: 45 mg/dL (ref <150)

## 2023-01-14 LAB — POCT URINALYSIS DIPSTICK
Bilirubin, UA: NEGATIVE
Blood, UA: NEGATIVE
Glucose, UA: NEGATIVE
Ketones, UA: NEGATIVE
Leukocytes, UA: NEGATIVE
Nitrite, UA: NEGATIVE
Protein, UA: NEGATIVE
Spec Grav, UA: 1.01 (ref 1.010–1.025)
Urobilinogen, UA: 0.2 E.U./dL
pH, UA: 7 (ref 5.0–8.0)

## 2023-01-14 LAB — HEMOCCULT GUIAC POC 1CARD (OFFICE): Fecal Occult Blood, POC: NEGATIVE

## 2023-01-14 LAB — PSA: PSA: 2.48 ng/mL (ref <=4.00)

## 2023-01-14 NOTE — Patient Instructions (Addendum)
It was a pleasure to see you today. Pneumococcal 20 given today. Labs are stable. Work on diet  and exercise.

## 2023-01-16 DIAGNOSIS — L03012 Cellulitis of left finger: Secondary | ICD-10-CM | POA: Insufficient documentation

## 2023-01-16 NOTE — Patient Instructions (Signed)
Referral to [facility] for Dr. Amedeo Plenty to check left third finger. Zithromax Z Pak and Depomedrol 80 mg IM for respiratory congestion persistent since having Covid-19.

## 2023-01-17 NOTE — Progress Notes (Signed)
Patient Care Team: Elby Showers, MD as PCP - General (Internal Medicine)  Visit Date: 01/17/23  Subjective:    Patient ID: Matthew Huff , Male   DOB: 08/25/1961, 62 y.o.    MRN: [id number]   62 y.o. Male presents today for a comprehensive physical exam. Patient has a past medical history of benign hypertension, left inguinal hernia, hyperlipidemia.   No lingering symptoms from upper respiratory infection treated with Depo 80 mg injection on 01/06/23. Reports did not take Azithromycin.   History of benign hypertension treated with Norvasc 5 mg daily, losartan-HCTZ 25 mg daily. Blood pressure at 128/78 today.   History of hyperlipidemia. CHOL elevated at 215 on 01/13/23.   History of arthritis treated with Mobic 15 mg daily. Reports receiving knee injections in the past.   History of Vitamin D deficiency treated with Vitamin D 1,000 units daily.    PSA normal at 2.48 on 01/13/23, down from 2.87 on 01/11/22.   Colonoscopy discussed. He will schedule the procedure.   Past Medical History:  Diagnosis Date   HTN (hypertension), benign    HTN (hypertension), benign    Left inguinal hernia 02/04/2011     Family History  Problem Relation Age of Onset   Diabetes Mother    Hypertension Mother    Hyperlipidemia Mother    Heart disease Mother    Hypertension Father    Hyperlipidemia Father    Diverticulitis Father    Stroke Father    Hypertension Sister     Social History   Social History Narrative       Social history: He is divorced.  1 adult daughter who is married.  Has moved to the mountains of [place] and formerly lived in [place].  Patient resides alone.  Enjoys fishing.  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  Social alcohol consumption.       Family history: Mother with history of diabetes and coronary artery disease died of congestive heart failure complications.  Father deceased with history of hypertension, atherosclerotic  cardiovascular disease and dementia.  1 sister overweight.              Review of Systems  Constitutional:  Negative for chills, fever, malaise/fatigue and weight loss.  HENT:  Negative for hearing loss, sinus pain and sore throat.   Respiratory:  Negative for cough and hemoptysis.   Cardiovascular:  Negative for chest pain, palpitations, leg swelling and PND.  Gastrointestinal:  Negative for abdominal pain, constipation, diarrhea, heartburn, nausea and vomiting.  Genitourinary:  Negative for dysuria, frequency and urgency.  Musculoskeletal:  Negative for back pain, myalgias and neck pain.  Skin:  Negative for itching and rash.  Neurological:  Negative for dizziness, tingling, seizures and headaches.  Endo/Heme/Allergies:  Negative for polydipsia.  Psychiatric/Behavioral:  Negative for depression. The patient is not nervous/anxious.         Objective:   Vitals: BP 128/78   Pulse 80   Temp 98.8 F (37.1 C) (Tympanic)   Ht 5' 9.25" (1.759 m)   Wt 221 lb (100.2 kg)   SpO2 98%   BMI 32.40 kg/m    Physical Exam Vitals and nursing note reviewed. Exam conducted with a chaperone present.  Constitutional:      General: He is awake. He is not in acute distress.    Appearance: Normal appearance. He is not ill-appearing or toxic-appearing.  HENT:     Head: Normocephalic and atraumatic.  Right Ear: Tympanic membrane, ear canal and external ear normal.     Left Ear: Tympanic membrane, ear canal and external ear normal.     Mouth/Throat:     Pharynx: Oropharynx is clear.  Eyes:     Extraocular Movements: Extraocular movements intact.     Pupils: Pupils are equal, round, and reactive to light.  Neck:     Thyroid: No thyroid mass, thyromegaly or thyroid tenderness.     Vascular: No carotid bruit.  Cardiovascular:     Rate and Rhythm: Normal rate and regular rhythm. No extrasystoles are present.    Pulses: Normal pulses.          Dorsalis pedis pulses are 2+ on the right side  and 2+ on the left side.       Posterior tibial pulses are 2+ on the right side and 2+ on the left side.     Heart sounds: Normal heart sounds. No murmur heard.    No friction rub. No gallop.  Pulmonary:     Effort: Pulmonary effort is normal.     Breath sounds: Normal breath sounds. No decreased breath sounds, wheezing, rhonchi or rales.  Chest:     Chest wall: No mass.  Abdominal:     Palpations: Abdomen is soft.     Tenderness: There is no abdominal tenderness.     Hernia: No hernia is present.  Genitourinary:    Prostate: Normal. Not enlarged and no nodules present.     Comments: Prostate smooth upon examination. Musculoskeletal:     Cervical back: Normal range of motion.     Right lower leg: No edema.     Left lower leg: No edema.  Lymphadenopathy:     Cervical: No cervical adenopathy.     Upper Body:     Right upper body: No supraclavicular adenopathy.     Left upper body: No supraclavicular adenopathy.  Skin:    General: Skin is warm and dry.  Neurological:     General: No focal deficit present.     Mental Status: He is alert and oriented to person, place, and time. Mental status is at baseline.     Cranial Nerves: Cranial nerves 2-12 are intact.     Sensory: Sensation is intact.     Motor: Motor function is intact.     Coordination: Coordination is intact.     Gait: Gait is intact.     Deep Tendon Reflexes: Reflexes are normal and symmetric.  Psychiatric:        Attention and Perception: Attention normal.        Mood and Affect: Mood normal.        Speech: Speech normal.        Behavior: Behavior normal. Behavior is cooperative.        Thought Content: Thought content normal.        Cognition and Memory: Cognition and memory normal.        Judgment: Judgment normal.       Results:   Studies obtained and personally reviewed by me:   Labs:       Component Value Date/Time   NA 139 01/13/2023 0925   K 3.5 01/13/2023 0925   CL 99 01/13/2023 0925   CO2 27  01/13/2023 0925   GLUCOSE 87 01/13/2023 0925   BUN 8 01/13/2023 0925   CREATININE 0.72 01/13/2023 0925   CALCIUM 9.8 01/13/2023 0925   PROT 7.0 01/13/2023 0925   ALBUMIN 5.1 06/13/2017 0902  AST 22 01/13/2023 0925   ALT 22 01/13/2023 0925   ALKPHOS 43 06/13/2017 0902   BILITOT 0.9 01/13/2023 0925   GFRNONAA 107 01/05/2021 1134   GFRAA 124 01/05/2021 1134     Lab Results  Component Value Date   WBC 6.1 01/13/2023   HGB 15.2 01/13/2023   HCT 43.2 01/13/2023   MCV 94.7 01/13/2023   PLT 220 01/13/2023    Lab Results  Component Value Date   CHOL 215 (H) 01/13/2023   HDL 125 01/13/2023   LDLCALC 77 01/13/2023   TRIG 45 01/13/2023   CHOLHDL 1.7 01/13/2023    No results found for: "HGBA1C"   No results found for: "TSH"   Lab Results  Component Value Date   PSA 2.48 01/13/2023   PSA 2.87 01/11/2022   PSA 3.52 01/05/2021      Assessment & Plan:   Hypertension: Well-controlled with Norvasc 5 mg daily, losartan-HCTZ 25 mg daily. Blood pressure at 128/78 today.   Hyperlipidemia: CHOL elevated at 215 on 01/13/23. Advised to improve diet and stay active to lose weight.   Arthritis: Treating with Mobic 15 mg daily. Reports receiving knee injections in the past.    Vitamin D deficiency: Well-controlled with Vitamin D 1,000 units daily.    Colonoscopy discussed. He will be scheduling the procedure.   Vaccine Counseling: Administered pneumococcal 20 vaccine. Declines Covid-19 booster.       I,Alexander Ruley,acting as a Education administrator for Elby Showers, MD.,have documented all relevant documentation on the behalf of Elby Showers, MD,as directed by  Elby Showers, MD while in the presence of Elby Showers, MD.   I, Elby Showers, MD, have reviewed all documentation for this visit. The documentation on 01/17/23 for the exam, diagnosis, procedures, and orders are all accurate and complete.

## 2023-01-20 ENCOUNTER — Other Ambulatory Visit: Payer: Self-pay | Admitting: Internal Medicine

## 2023-01-31 IMAGING — CR DG CHEST 2V
2 series · 2 of 2 positions shown · non-contrast
Comparison: April 27, 2011.

CLINICAL DATA: Pneumonia.

EXAM:
CHEST - 2 VIEW

[w chest pa]
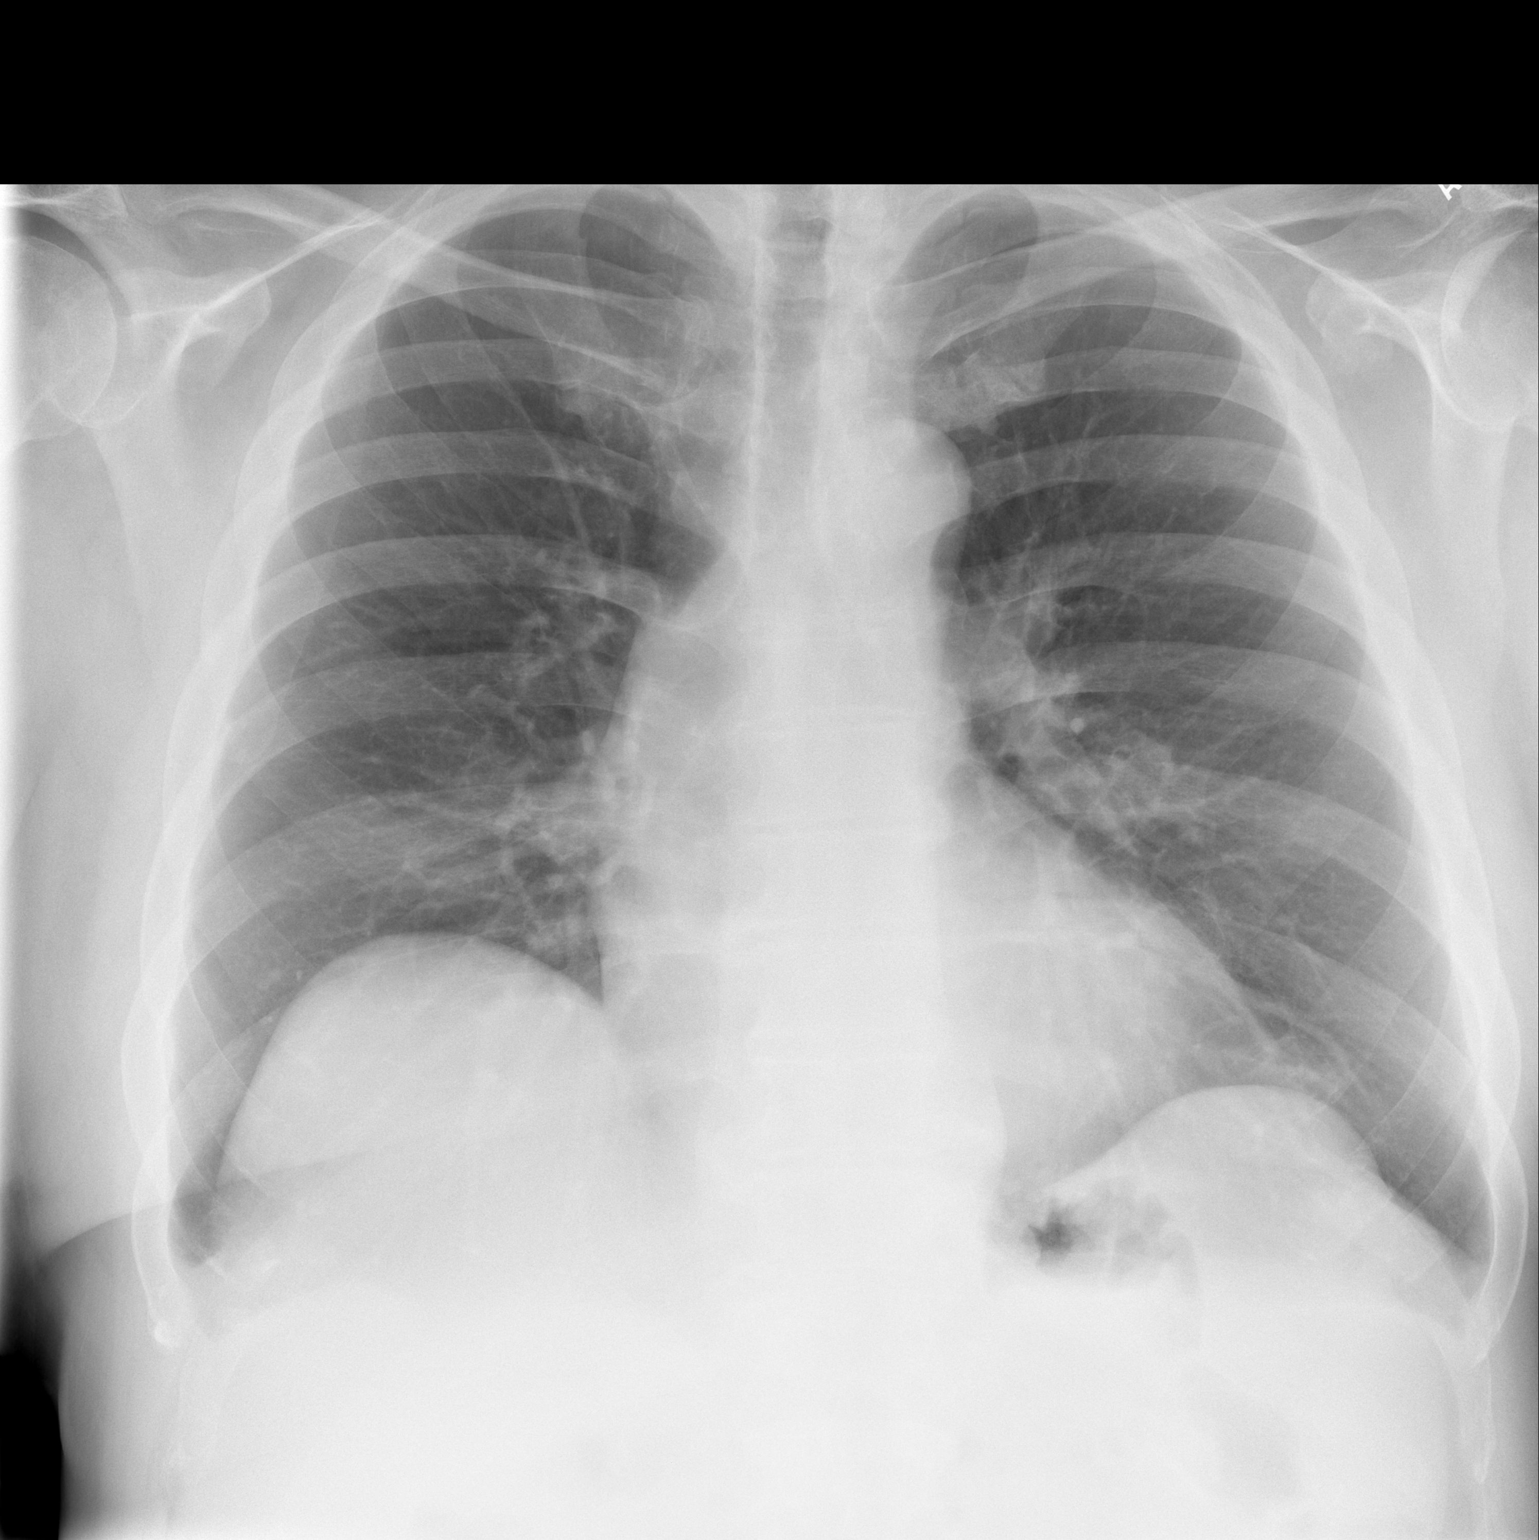

[w chest lat *]
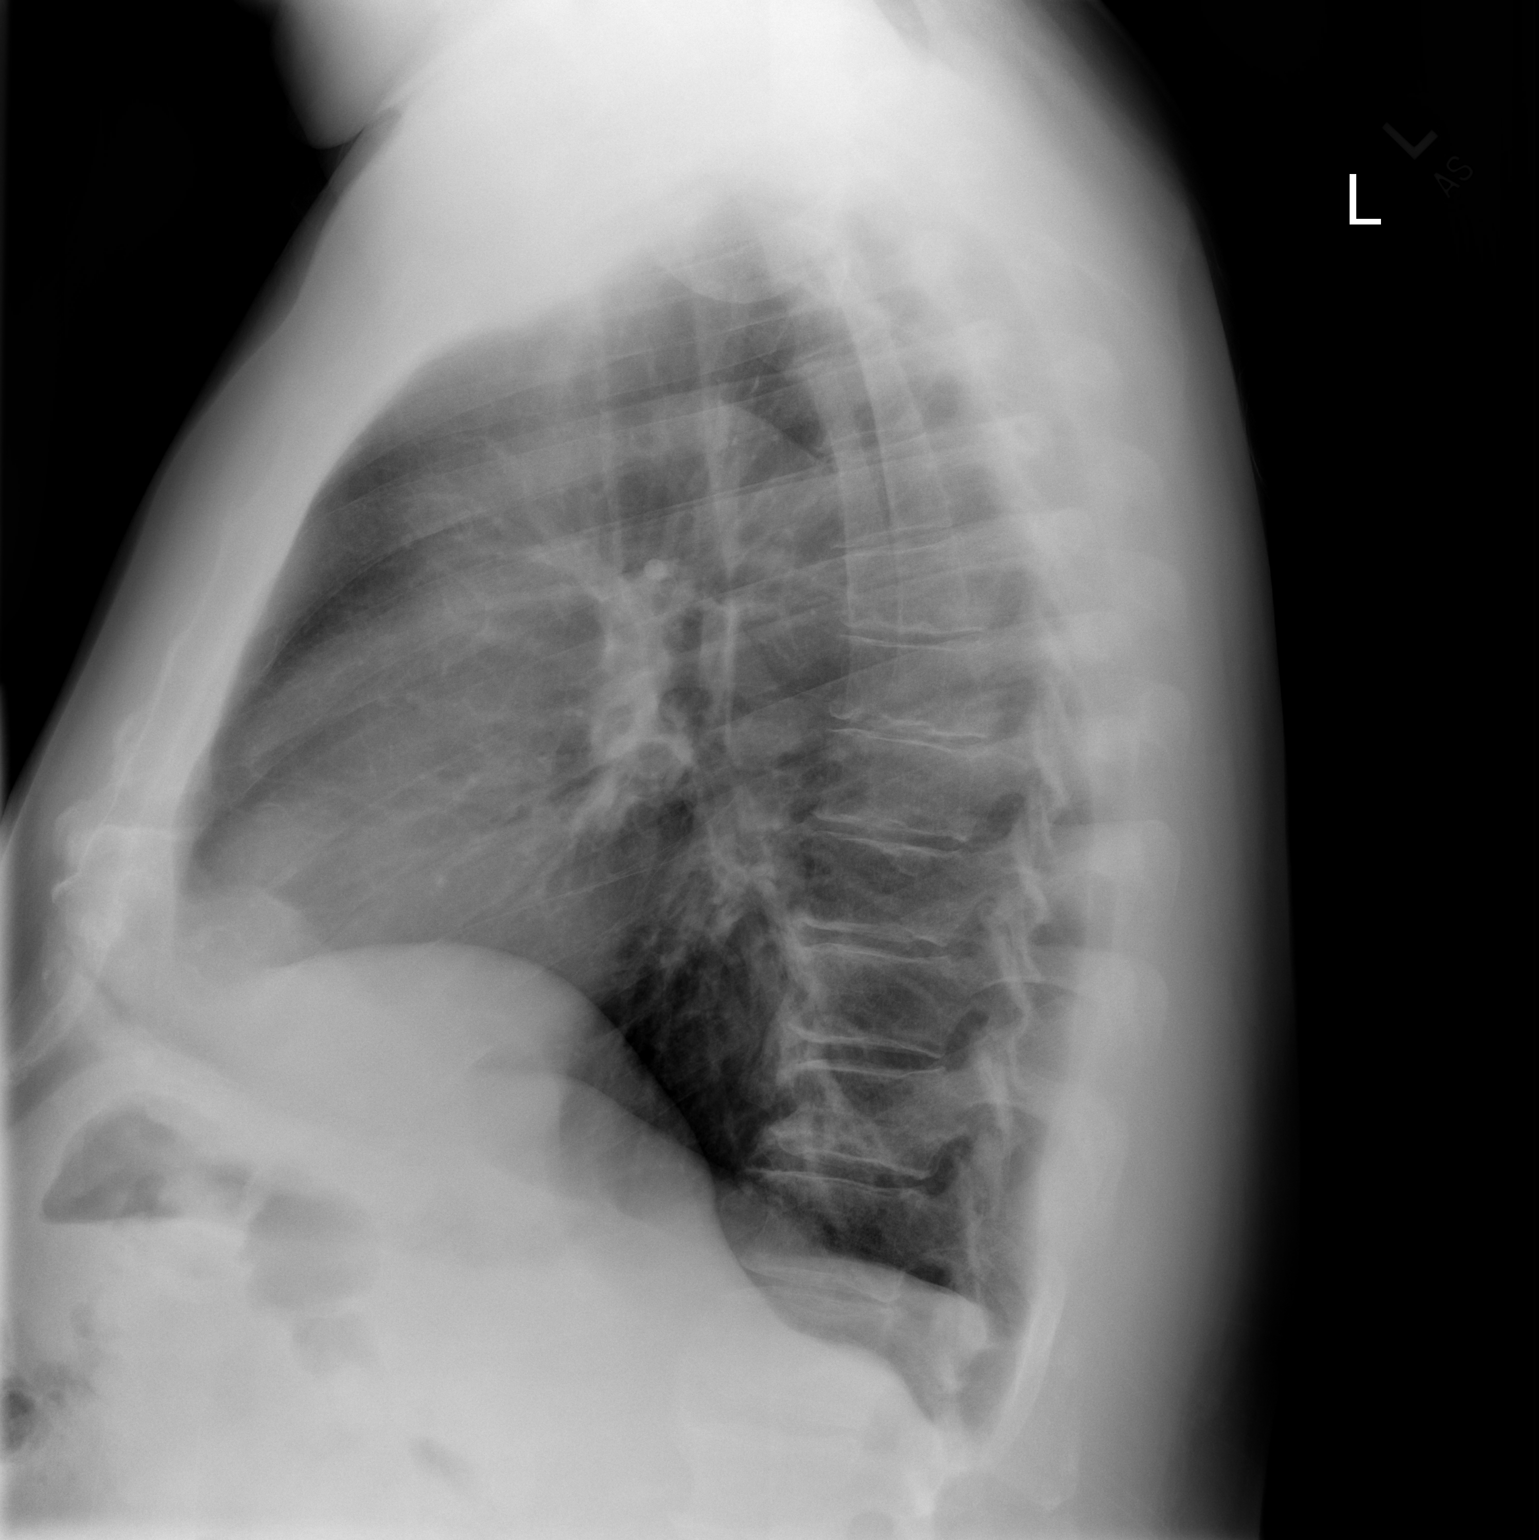

[2 of 2 positions shown; findings below may reference images not displayed]

FINDINGS: The heart size and mediastinal contours are within normal limits.
Both lungs are clear. The visualized skeletal structures are
unremarkable.
IMPRESSION: No active cardiopulmonary disease.

## 2023-03-08 ENCOUNTER — Ambulatory Visit: Payer: BC Managed Care – PPO | Admitting: Internal Medicine

## 2023-03-08 ENCOUNTER — Telehealth: Payer: Self-pay | Admitting: Internal Medicine

## 2023-03-08 ENCOUNTER — Encounter: Payer: Self-pay | Admitting: Internal Medicine

## 2023-03-08 VITALS — BP 128/80 | HR 88 | Temp 99.3°F | Ht 69.25 in | Wt 218.8 lb

## 2023-03-08 DIAGNOSIS — L509 Urticaria, unspecified: Secondary | ICD-10-CM

## 2023-03-08 MED ORDER — METHYLPREDNISOLONE 4 MG PO TABS: ORAL_TABLET | ORAL | 1 refills | Status: DC

## 2023-03-08 NOTE — Patient Instructions (Addendum)
Considerations include idiopathic or possibly medication related. Trying Medrol Dose pack 4 mg in tapering course.Could be thiazide diuretic or Mobic possibly antihypertensive meds or idiopathic urticaria. Given Depomedrol 80 mg IM and a 6 day tapering course of medrol 4 mg tabs with one refill. May use Calamine lotion for itching.

## 2023-03-08 NOTE — Telephone Encounter (Signed)
Ed [id number] [phone]  Ed called too say he has had a eash for about 5 days, he went to pharmacy yesterday they said it looked like hives. I scheduled him to come in at 12:00 today.

## 2023-03-08 NOTE — Progress Notes (Signed)
Patient Care Team: Elby Showers, MD as PCP - General (Internal Medicine)  Visit Date: 03/08/23  Subjective:    Patient ID: Matthew Huff , Male   DOB: 1961-01-11, 62 y.o.    MRN: [id number]   63 y.o. Male presents today for an itchy rash that he noticed on 03/03/23. He is using Claritin. Has not been sick recently. Denies sore throat, shortness of breath. He has had small red areas on his left leg for an extended period and this new rash is separate. Has not been camping recently. Has some situational stress due to daughter driving back from a trip out [place].  Past Medical History:  Diagnosis Date   HTN (hypertension), benign    HTN (hypertension), benign    Left inguinal hernia 02/04/2011     Family History  Problem Relation Age of Onset   Diabetes Mother    Hypertension Mother    Hyperlipidemia Mother    Heart disease Mother    Hypertension Father    Hyperlipidemia Father    Diverticulitis Father    Stroke Father    Hypertension Sister     Social History   Social History Narrative       Social history: He is divorced.  1 adult daughter who is married.  Has moved to the mountains of [place] and formerly lived in [place].  Patient resides alone.  Enjoys fishing.  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  Social alcohol consumption.       Family history: Mother with history of diabetes and coronary artery disease died of congestive heart failure complications.  Father deceased with history of hypertension, atherosclerotic cardiovascular disease and dementia.  1 sister overweight.              Review of Systems  Constitutional:  Negative for fever and malaise/fatigue.  HENT:  Negative for congestion.   Eyes:  Negative for blurred vision.  Respiratory:  Negative for cough and shortness of breath.   Cardiovascular:  Negative for chest pain, palpitations and leg swelling.  Gastrointestinal:  Negative for vomiting.   Musculoskeletal:  Negative for back pain.  Skin:  Positive for itching and rash.  Neurological:  Negative for loss of consciousness and headaches.        Objective:   Vitals: BP 128/80   Pulse 88   Temp 99.3 F (37.4 C) (Tympanic)   Ht 5' 9.25" (1.759 m)   Wt 218 lb 12.8 oz (99.2 kg)   SpO2 96%   BMI 32.08 kg/m    Physical Exam Vitals and nursing note reviewed.  Constitutional:      General: He is not in acute distress.    Appearance: Normal appearance. He is not ill-appearing.  HENT:     Head: Normocephalic and atraumatic.  Cardiovascular:     Rate and Rhythm: Normal rate and regular rhythm.     Pulses: Normal pulses.     Heart sounds: Normal heart sounds. No murmur heard.    No friction rub. No gallop.  Pulmonary:     Effort: Pulmonary effort is normal. No respiratory distress.     Breath sounds: Normal breath sounds. No wheezing or rales.  Skin:    General: Skin is warm and dry.     Findings: Rash present. Rash is macular and papular. Rash is not vesicular.     Comments: Small raised discrete areas of erythema on left leg. Confluent erythema on volar aspects of  bilateral arms, mostly macular with some papular areas, no vesicles.  Neurological:     Mental Status: He is alert and oriented to person, place, and time. Mental status is at baseline.  Psychiatric:        Mood and Affect: Mood normal.        Behavior: Behavior normal.        Thought Content: Thought content normal.        Judgment: Judgment normal.       Results:   Studies obtained and personally reviewed by me:   Labs:       Component Value Date/Time   NA 139 01/13/2023 0925   K 3.5 01/13/2023 0925   CL 99 01/13/2023 0925   CO2 27 01/13/2023 0925   GLUCOSE 87 01/13/2023 0925   BUN 8 01/13/2023 0925   CREATININE 0.72 01/13/2023 0925   CALCIUM 9.8 01/13/2023 0925   PROT 7.0 01/13/2023 0925   ALBUMIN 5.1 06/13/2017 0902   AST 22 01/13/2023 0925   ALT 22 01/13/2023 0925   ALKPHOS 43  06/13/2017 0902   BILITOT 0.9 01/13/2023 0925   GFRNONAA 107 01/05/2021 1134   GFRAA 124 01/05/2021 1134     Lab Results  Component Value Date   WBC 6.1 01/13/2023   HGB 15.2 01/13/2023   HCT 43.2 01/13/2023   MCV 94.7 01/13/2023   PLT 220 01/13/2023    Lab Results  Component Value Date   CHOL 215 (H) 01/13/2023   HDL 125 01/13/2023   LDLCALC 77 01/13/2023   TRIG 45 01/13/2023   CHOLHDL 1.7 01/13/2023    No results found for: "HGBA1C"   No results found for: "TSH"   Lab Results  Component Value Date   PSA 2.48 01/13/2023   PSA 2.87 01/11/2022   PSA 3.52 01/05/2021      Assessment & Plan:   Pruritus/Nonspecific dermatitis: etiology unclear. Prescribed Triamcinolone 0.1% with Eucerin cream 1:1, Medrol dosepak tapering course take as directed.    I,Alexander Ruley,acting as a Education administrator for Elby Showers, MD.,have documented all relevant documentation on the behalf of Elby Showers, MD,as directed by  Elby Showers, MD while in the presence of Elby Showers, MD.   I, Elby Showers, MD, have reviewed all documentation for this visit. The documentation on 03/10/23 for the exam, diagnosis, procedures, and orders are all accurate and complete.

## 2023-03-10 ENCOUNTER — Encounter: Payer: Self-pay | Admitting: Internal Medicine

## 2023-03-10 ENCOUNTER — Telehealth: Payer: Self-pay | Admitting: Internal Medicine

## 2023-03-10 ENCOUNTER — Ambulatory Visit: Payer: BC Managed Care – PPO | Admitting: Internal Medicine

## 2023-03-10 VITALS — BP 134/84 | HR 86 | Temp 98.4°F | Ht 69.25 in | Wt 220.0 lb

## 2023-03-10 DIAGNOSIS — S91115A Laceration without foreign body of left lesser toe(s) without damage to nail, initial encounter: Secondary | ICD-10-CM

## 2023-03-10 MED ORDER — CEPHALEXIN 500 MG PO CAPS: 500.0000 mg | ORAL_CAPSULE | Freq: Two times a day (BID) | ORAL | 0 refills | Status: DC

## 2023-03-10 MED ORDER — CEPHALEXIN 500 MG PO CAPS: 500.0000 mg | ORAL_CAPSULE | Freq: Two times a day (BID) | ORAL | Status: DC

## 2023-03-10 NOTE — Telephone Encounter (Signed)
Ed [id number] [phone]  Ed called to say he has stumped his big toe this morning and he does not know if it needs stiches, it bleed a lot. He wants to come in and let you look at it first. He has got the bleeding stopped.

## 2023-03-10 NOTE — Patient Instructions (Addendum)
Laceration cleaned and dressed in sterile fashion. Tdap is up to date. Prescribed Keflex twice daily for 5 days and mupirocin ointment to use topically. Need to keep laceration dressed until healed. Call if develops signs of infection. Follow up April 8 th if needed.  If rash is not improving, will refer to Dermatologist

## 2023-03-10 NOTE — Telephone Encounter (Signed)
scheduled

## 2023-03-10 NOTE — Progress Notes (Signed)
Patient Care Team: Elby Showers, MD as PCP - General (Internal Medicine)  Visit Date: 03/10/23  Subjective:    Patient ID: Matthew Huff , Male   DOB: 1961-11-05, 62 y.o.    MRN: [id number]   62 y.o. Male presents today for left fourth toe laceration. He cut his toe on a piece of porcelain this morning at 3 am in his bathroom.  It bled considerably. He was reluctant to go to ED due to cost. He has taken the day off from work. Tetanus vaccine is up-to-date.   Recently seen April 2 for itchy rash onset March 28.Prescribed Medrol dosepak and also prescribed Triamcinolone cream mixed with Eucerin cream but pt has not picked this up yet as pharmacy said Rx not received by our office although we faxed it twice. Rash is still predent but may be slowly imoroving.  Past Medical History:  Diagnosis Date   HTN (hypertension), benign    HTN (hypertension), benign    Left inguinal hernia 02/04/2011     Family History  Problem Relation Age of Onset   Diabetes Mother    Hypertension Mother    Hyperlipidemia Mother    Heart disease Mother    Hypertension Father    Hyperlipidemia Father    Diverticulitis Father    Stroke Father    Hypertension Sister     Social History   Social History Narrative       Social history: He is divorced.  1 adult daughter who is married.  Has moved to the mountains of [place] and formerly lived in [place].  Patient resides alone.  Enjoys fishing.  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  Social alcohol consumption.       Family history: Mother with history of diabetes and coronary artery disease died of congestive heart failure complications.  Father deceased with history of hypertension, atherosclerotic cardiovascular disease and dementia.  1 sister overweight.              Review of Systems  Constitutional:  Negative for fever and malaise/fatigue.  HENT:  Negative for congestion.   Eyes:  Negative for  blurred vision.  Respiratory:  Negative for cough and shortness of breath.   Cardiovascular:  Negative for chest pain, palpitations and leg swelling.  Gastrointestinal:  Negative for vomiting.  Musculoskeletal:  Negative for back pain.  Skin:  Negative for rash.       (+) Left fourth toe laceration  Neurological:  Negative for loss of consciousness and headaches.        Objective:   Vitals: BP 134/84   Pulse 86   Temp 98.4 F (36.9 C) (Tympanic)   Ht 5' 9.25" (1.759 m)   Wt 220 lb (99.8 kg)   SpO2 97%   BMI 32.25 kg/m    Physical Exam Vitals and nursing note reviewed.  Constitutional:      General: He is not in acute distress.    Appearance: Normal appearance. He is not ill-appearing.  HENT:     Head: Normocephalic and atraumatic.  Pulmonary:     Effort: Pulmonary effort is normal.  Musculoskeletal:     Comments: Semi-circular superficial laceration on distal tip of left fourth toe plantar aspect.  Skin:    General: Skin is warm and dry.  Neurological:     Mental Status: He is alert and oriented to person, place, and time. Mental status is at baseline.  Psychiatric:  Mood and Affect: Mood normal.        Behavior: Behavior normal.        Thought Content: Thought content normal.        Judgment: Judgment normal.      Results:   Studies obtained and personally reviewed by me:   Labs:       Component Value Date/Time   NA 139 01/13/2023 0925   K 3.5 01/13/2023 0925   CL 99 01/13/2023 0925   CO2 27 01/13/2023 0925   GLUCOSE 87 01/13/2023 0925   BUN 8 01/13/2023 0925   CREATININE 0.72 01/13/2023 0925   CALCIUM 9.8 01/13/2023 0925   PROT 7.0 01/13/2023 0925   ALBUMIN 5.1 06/13/2017 0902   AST 22 01/13/2023 0925   ALT 22 01/13/2023 0925   ALKPHOS 43 06/13/2017 0902   BILITOT 0.9 01/13/2023 0925   GFRNONAA 107 01/05/2021 1134   GFRAA 124 01/05/2021 1134     Lab Results  Component Value Date   WBC 6.1 01/13/2023   HGB 15.2 01/13/2023   HCT  43.2 01/13/2023   MCV 94.7 01/13/2023   PLT 220 01/13/2023    Lab Results  Component Value Date   CHOL 215 (H) 01/13/2023   HDL 125 01/13/2023   LDLCALC 77 01/13/2023   TRIG 45 01/13/2023   CHOLHDL 1.7 01/13/2023    No results found for: "HGBA1C"   No results found for: "TSH"   Lab Results  Component Value Date   PSA 2.48 01/13/2023   PSA 2.87 01/11/2022   PSA 3.52 01/05/2021      Assessment & Plan:   Left fourth toe laceration: cleaned, soaked with Hibiclens, bandaged. Clean with peroxide and change dressing twice daily. Watch for signs of infection (fever, discoloration, shaking chills).  Return on 03/14/23 for office visit.    I,Alexander Ruley,acting as a Education administrator for Elby Showers, MD.,have documented all relevant documentation on the behalf of Elby Showers, MD,as directed by  Elby Showers, MD while in the presence of Elby Showers, MD.   I, Elby Showers, MD, have reviewed all documentation for this visit. The documentation on 03/10/23 for the exam, diagnosis, procedures, and orders are all accurate and complete.

## 2023-05-20 ENCOUNTER — Telehealth: Payer: Self-pay | Admitting: Internal Medicine

## 2023-05-20 ENCOUNTER — Encounter: Payer: Self-pay | Admitting: Internal Medicine

## 2023-05-20 NOTE — Telephone Encounter (Signed)
Patient called earlier in the week asking for assistance with Alcohol use disorder. Told him to contact [facility]. He says he needs FMLA paperwork completed. He must contact employer and have this sent to [place]. He needs to go ahead and agree to treatment at [facility] by contacting them. They prefer to deal with the patient directly. His daughter who is a Engineer, civil (consulting) that resides out of town is aware of the situation, he says. MJB, MD

## 2023-05-22 DIAGNOSIS — Z0289 Encounter for other administrative examinations: Secondary | ICD-10-CM

## 2023-05-23 ENCOUNTER — Telehealth (INDEPENDENT_AMBULATORY_CARE_PROVIDER_SITE_OTHER): Payer: BC Managed Care – PPO | Admitting: Internal Medicine

## 2023-05-23 ENCOUNTER — Telehealth: Payer: Self-pay | Admitting: Internal Medicine

## 2023-05-23 ENCOUNTER — Encounter: Payer: Self-pay | Admitting: Internal Medicine

## 2023-05-23 DIAGNOSIS — D696 Thrombocytopenia, unspecified: Secondary | ICD-10-CM | POA: Diagnosis not present

## 2023-05-23 DIAGNOSIS — F1090 Alcohol use, unspecified, uncomplicated: Secondary | ICD-10-CM

## 2023-05-23 DIAGNOSIS — F1020 Alcohol dependence, uncomplicated: Secondary | ICD-10-CM | POA: Diagnosis not present

## 2023-05-23 DIAGNOSIS — M130 Polyarthritis, unspecified: Secondary | ICD-10-CM | POA: Diagnosis not present

## 2023-05-23 DIAGNOSIS — Z87891 Personal history of nicotine dependence: Secondary | ICD-10-CM | POA: Diagnosis not present

## 2023-05-23 DIAGNOSIS — I10 Essential (primary) hypertension: Secondary | ICD-10-CM | POA: Diagnosis not present

## 2023-05-23 DIAGNOSIS — F5101 Primary insomnia: Secondary | ICD-10-CM | POA: Diagnosis not present

## 2023-05-23 DIAGNOSIS — L309 Dermatitis, unspecified: Secondary | ICD-10-CM | POA: Diagnosis not present

## 2023-05-23 NOTE — Telephone Encounter (Signed)
Received FMLA and Short Term Disability paperwork to complete and fax back.  Completed gave originals to patient and faxed & emailed copy to [facility] of [place] [address], [place] Phone [phone] Fax [phone]. Email [email or web address]  This message was sent via FAXCOM, a product from [employer]. http://www.biscom.com/                    -------Fax Transmission Report-------  To:               Recipient at [phone] Subject:          Fw: Hp Scans Result:           The transmission was successful. Explanation:      All Pages Ok Pages Sent:       12 Connect Time:     9 minutes, 51 seconds Transmit Time:    05/23/2023 12:48 Transfer Rate:    14400 Status Code:      0000 Retry Count:      0 Job Id:           [id number] Unique Id:        [id number] Fax Line:         19 Fax Server:       [id number]

## 2023-05-23 NOTE — Telephone Encounter (Signed)
Scheduled appointment

## 2023-05-23 NOTE — Patient Instructions (Signed)
Patient is being referred to [facility] paperwork will be completed for him.

## 2023-05-23 NOTE — Progress Notes (Signed)
   Subjective:    Patient ID: Matthew Huff, male    DOB: 10-Jan-1961, 62 y.o.   MRN: [id number]  HPI I connected today with Matthew Huff by interactive audio and video telecommunications.  He is a longstanding patient in this practice.  He currently is at his ex-wife's home with his daughter, Matthew Huff, present.  I am at my office.  He is agreeable to visit in this format today.  Matthew Huff has a history of hypertension which is well-controlled on current regimen of amlodipine 5 mg daily and losartan/HCTZ 100/25 daily.  He has a history of musculoskeletal pain for which he takes Mobic 15 mg daily as needed.  He has no known drug allergies.  His tetanus immunization is up-to-date having been given in November 2023.  He received pneumococcal 20 vaccine in February 2024.  He had acute COVID-19 virus infection January 2024, contusion of his right knee in June 2023.  History of sciatica and low back pain 2018.  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  Used to do a lot of hiking and at 1 point lost about 50 pounds a few years ago prior to the pandemic.  Became less active during the pandemic.  History of left knee pain in February 2018 seen by orthopedist and thought he had meniscal tear.  This occurred at work when he slipped.  History of fractured right arm at age 44.  Fractured ankle at age 11.  No known drug allergies.  Social history: He is divorced.  1 adult daughter who resides near [place] and is married.  Patient does not smoke.  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  Family history: Mother with history of diabetes and coronary artery disease died of congestive heart failure complications.  Father died with history of hypertension and atherosclerotic cardiovascular disease and dementia.  1 sister who is overweight.  He called recently and said he was drinking heavily and would like treatment.  I have recommended he  be evaluated at [facility] and he is agreeable to this.    Review of Systems see above     Objective:   Physical Exam  He is seen at home with his daughter present.  He is alert and able to give a clear concise history and is agreeable to going to [facility].      Assessment & Plan:   Alcohol use disorder-being referred to [facility] for evaluation and treatment.  His daughter is supportive.  FMLA paperwork for his job will be completed today.

## 2023-05-29 DIAGNOSIS — L309 Dermatitis, unspecified: Secondary | ICD-10-CM | POA: Diagnosis not present

## 2023-05-29 DIAGNOSIS — I10 Essential (primary) hypertension: Secondary | ICD-10-CM | POA: Diagnosis not present

## 2023-05-29 DIAGNOSIS — F1020 Alcohol dependence, uncomplicated: Secondary | ICD-10-CM | POA: Diagnosis not present

## 2023-05-29 DIAGNOSIS — M130 Polyarthritis, unspecified: Secondary | ICD-10-CM | POA: Diagnosis not present

## 2023-05-29 DIAGNOSIS — D696 Thrombocytopenia, unspecified: Secondary | ICD-10-CM | POA: Diagnosis not present

## 2023-05-29 DIAGNOSIS — F5101 Primary insomnia: Secondary | ICD-10-CM | POA: Diagnosis not present

## 2023-05-29 DIAGNOSIS — Z87891 Personal history of nicotine dependence: Secondary | ICD-10-CM | POA: Diagnosis not present

## 2023-06-13 DIAGNOSIS — I10 Essential (primary) hypertension: Secondary | ICD-10-CM | POA: Diagnosis not present

## 2023-06-13 DIAGNOSIS — D696 Thrombocytopenia, unspecified: Secondary | ICD-10-CM | POA: Diagnosis not present

## 2023-06-13 DIAGNOSIS — L309 Dermatitis, unspecified: Secondary | ICD-10-CM | POA: Diagnosis not present

## 2023-06-13 DIAGNOSIS — M130 Polyarthritis, unspecified: Secondary | ICD-10-CM | POA: Diagnosis not present

## 2023-06-13 DIAGNOSIS — Z87891 Personal history of nicotine dependence: Secondary | ICD-10-CM | POA: Diagnosis not present

## 2023-06-13 DIAGNOSIS — F5101 Primary insomnia: Secondary | ICD-10-CM | POA: Diagnosis not present

## 2023-06-13 DIAGNOSIS — F1020 Alcohol dependence, uncomplicated: Secondary | ICD-10-CM | POA: Diagnosis not present

## 2023-06-14 DIAGNOSIS — Z87891 Personal history of nicotine dependence: Secondary | ICD-10-CM | POA: Diagnosis not present

## 2023-06-14 DIAGNOSIS — F1020 Alcohol dependence, uncomplicated: Secondary | ICD-10-CM | POA: Diagnosis not present

## 2023-06-14 DIAGNOSIS — D696 Thrombocytopenia, unspecified: Secondary | ICD-10-CM | POA: Diagnosis not present

## 2023-06-14 DIAGNOSIS — I10 Essential (primary) hypertension: Secondary | ICD-10-CM | POA: Diagnosis not present

## 2023-06-14 DIAGNOSIS — F5101 Primary insomnia: Secondary | ICD-10-CM | POA: Diagnosis not present

## 2023-06-14 DIAGNOSIS — L309 Dermatitis, unspecified: Secondary | ICD-10-CM | POA: Diagnosis not present

## 2023-06-14 DIAGNOSIS — M130 Polyarthritis, unspecified: Secondary | ICD-10-CM | POA: Diagnosis not present

## 2023-06-15 DIAGNOSIS — D696 Thrombocytopenia, unspecified: Secondary | ICD-10-CM | POA: Diagnosis not present

## 2023-06-15 DIAGNOSIS — M130 Polyarthritis, unspecified: Secondary | ICD-10-CM | POA: Diagnosis not present

## 2023-06-15 DIAGNOSIS — F5101 Primary insomnia: Secondary | ICD-10-CM | POA: Diagnosis not present

## 2023-06-15 DIAGNOSIS — F1020 Alcohol dependence, uncomplicated: Secondary | ICD-10-CM | POA: Diagnosis not present

## 2023-06-15 DIAGNOSIS — Z87891 Personal history of nicotine dependence: Secondary | ICD-10-CM | POA: Diagnosis not present

## 2023-06-15 DIAGNOSIS — L309 Dermatitis, unspecified: Secondary | ICD-10-CM | POA: Diagnosis not present

## 2023-06-15 DIAGNOSIS — I10 Essential (primary) hypertension: Secondary | ICD-10-CM | POA: Diagnosis not present

## 2023-06-16 DIAGNOSIS — F1020 Alcohol dependence, uncomplicated: Secondary | ICD-10-CM | POA: Diagnosis not present

## 2023-06-16 DIAGNOSIS — Z87891 Personal history of nicotine dependence: Secondary | ICD-10-CM | POA: Diagnosis not present

## 2023-06-16 DIAGNOSIS — D696 Thrombocytopenia, unspecified: Secondary | ICD-10-CM | POA: Diagnosis not present

## 2023-06-16 DIAGNOSIS — I10 Essential (primary) hypertension: Secondary | ICD-10-CM | POA: Diagnosis not present

## 2023-06-16 DIAGNOSIS — F5101 Primary insomnia: Secondary | ICD-10-CM | POA: Diagnosis not present

## 2023-06-16 DIAGNOSIS — M130 Polyarthritis, unspecified: Secondary | ICD-10-CM | POA: Diagnosis not present

## 2023-06-16 DIAGNOSIS — L309 Dermatitis, unspecified: Secondary | ICD-10-CM | POA: Diagnosis not present

## 2023-06-17 DIAGNOSIS — F1020 Alcohol dependence, uncomplicated: Secondary | ICD-10-CM | POA: Diagnosis not present

## 2023-06-17 DIAGNOSIS — I10 Essential (primary) hypertension: Secondary | ICD-10-CM | POA: Diagnosis not present

## 2023-06-17 DIAGNOSIS — D696 Thrombocytopenia, unspecified: Secondary | ICD-10-CM | POA: Diagnosis not present

## 2023-06-17 DIAGNOSIS — L309 Dermatitis, unspecified: Secondary | ICD-10-CM | POA: Diagnosis not present

## 2023-06-17 DIAGNOSIS — Z87891 Personal history of nicotine dependence: Secondary | ICD-10-CM | POA: Diagnosis not present

## 2023-06-17 DIAGNOSIS — F5101 Primary insomnia: Secondary | ICD-10-CM | POA: Diagnosis not present

## 2023-06-17 DIAGNOSIS — M130 Polyarthritis, unspecified: Secondary | ICD-10-CM | POA: Diagnosis not present

## 2023-06-18 DIAGNOSIS — Z87891 Personal history of nicotine dependence: Secondary | ICD-10-CM | POA: Diagnosis not present

## 2023-06-18 DIAGNOSIS — F5101 Primary insomnia: Secondary | ICD-10-CM | POA: Diagnosis not present

## 2023-06-18 DIAGNOSIS — F1020 Alcohol dependence, uncomplicated: Secondary | ICD-10-CM | POA: Diagnosis not present

## 2023-06-18 DIAGNOSIS — D696 Thrombocytopenia, unspecified: Secondary | ICD-10-CM | POA: Diagnosis not present

## 2023-06-18 DIAGNOSIS — L309 Dermatitis, unspecified: Secondary | ICD-10-CM | POA: Diagnosis not present

## 2023-06-18 DIAGNOSIS — I10 Essential (primary) hypertension: Secondary | ICD-10-CM | POA: Diagnosis not present

## 2023-06-18 DIAGNOSIS — M130 Polyarthritis, unspecified: Secondary | ICD-10-CM | POA: Diagnosis not present

## 2023-06-19 DIAGNOSIS — F1020 Alcohol dependence, uncomplicated: Secondary | ICD-10-CM | POA: Diagnosis not present

## 2023-06-19 DIAGNOSIS — L309 Dermatitis, unspecified: Secondary | ICD-10-CM | POA: Diagnosis not present

## 2023-06-19 DIAGNOSIS — I10 Essential (primary) hypertension: Secondary | ICD-10-CM | POA: Diagnosis not present

## 2023-06-19 DIAGNOSIS — F5101 Primary insomnia: Secondary | ICD-10-CM | POA: Diagnosis not present

## 2023-06-19 DIAGNOSIS — M130 Polyarthritis, unspecified: Secondary | ICD-10-CM | POA: Diagnosis not present

## 2023-06-19 DIAGNOSIS — Z87891 Personal history of nicotine dependence: Secondary | ICD-10-CM | POA: Diagnosis not present

## 2023-06-19 DIAGNOSIS — D696 Thrombocytopenia, unspecified: Secondary | ICD-10-CM | POA: Diagnosis not present

## 2023-06-20 DIAGNOSIS — I10 Essential (primary) hypertension: Secondary | ICD-10-CM | POA: Diagnosis not present

## 2023-06-20 DIAGNOSIS — F1020 Alcohol dependence, uncomplicated: Secondary | ICD-10-CM | POA: Diagnosis not present

## 2023-06-20 DIAGNOSIS — L309 Dermatitis, unspecified: Secondary | ICD-10-CM | POA: Diagnosis not present

## 2023-06-20 DIAGNOSIS — F5101 Primary insomnia: Secondary | ICD-10-CM | POA: Diagnosis not present

## 2023-06-20 DIAGNOSIS — Z87891 Personal history of nicotine dependence: Secondary | ICD-10-CM | POA: Diagnosis not present

## 2023-06-20 DIAGNOSIS — D696 Thrombocytopenia, unspecified: Secondary | ICD-10-CM | POA: Diagnosis not present

## 2023-06-20 DIAGNOSIS — M130 Polyarthritis, unspecified: Secondary | ICD-10-CM | POA: Diagnosis not present

## 2023-06-21 DIAGNOSIS — F1020 Alcohol dependence, uncomplicated: Secondary | ICD-10-CM | POA: Diagnosis not present

## 2023-06-21 DIAGNOSIS — Z87891 Personal history of nicotine dependence: Secondary | ICD-10-CM | POA: Diagnosis not present

## 2023-06-21 DIAGNOSIS — I10 Essential (primary) hypertension: Secondary | ICD-10-CM | POA: Diagnosis not present

## 2023-06-21 DIAGNOSIS — F5101 Primary insomnia: Secondary | ICD-10-CM | POA: Diagnosis not present

## 2023-06-21 DIAGNOSIS — L309 Dermatitis, unspecified: Secondary | ICD-10-CM | POA: Diagnosis not present

## 2023-06-21 DIAGNOSIS — D696 Thrombocytopenia, unspecified: Secondary | ICD-10-CM | POA: Diagnosis not present

## 2023-06-21 DIAGNOSIS — M130 Polyarthritis, unspecified: Secondary | ICD-10-CM | POA: Diagnosis not present

## 2023-06-23 ENCOUNTER — Telehealth: Payer: Self-pay

## 2023-06-23 ENCOUNTER — Telehealth: Payer: Self-pay | Admitting: Internal Medicine

## 2023-06-23 DIAGNOSIS — Z79891 Long term (current) use of opiate analgesic: Secondary | ICD-10-CM | POA: Diagnosis not present

## 2023-06-23 DIAGNOSIS — Z79899 Other long term (current) drug therapy: Secondary | ICD-10-CM | POA: Diagnosis not present

## 2023-06-23 DIAGNOSIS — F1020 Alcohol dependence, uncomplicated: Secondary | ICD-10-CM | POA: Diagnosis not present

## 2023-06-23 DIAGNOSIS — F1120 Opioid dependence, uncomplicated: Secondary | ICD-10-CM | POA: Diagnosis not present

## 2023-06-23 NOTE — Telephone Encounter (Signed)
Leonette Most [employer] [id number]  Claim # [id number]  Dianna left voice message on 06/22/2023 with questions about patient and his claim.

## 2023-06-23 NOTE — Telephone Encounter (Signed)
Called back and LVM to return my call

## 2023-06-23 NOTE — Telephone Encounter (Signed)
Transition Care Management Follow-up Telephone Call Date of discharge and from where: [facility]  How have you been since you were released from the hospital? Doing well Any questions or concerns? No  Items Reviewed: Did the pt receive and understand the discharge instructions provided? Yes  Medications obtained and verified? Yes -Naltrexone Other? No  Any new allergies since your discharge? No  Dietary orders reviewed? No Do you have support at home? Yes   Home Care and Equipment/Supplies: Were home health services ordered? not applicable If so, what is the name of the agency? N/a  Has the agency set up a time to come to the patient's home? not applicable Were any new equipment or medical supplies ordered?  No What is the name of the medical supply agency? N/a Were you able to get the supplies/equipment? not applicable Do you have any questions related to the use of the equipment or supplies? No  Functional Questionnaire: (I = Independent and D = Dependent) ADLs: I  Bathing/Dressing- I  Meal Prep- I  Eating- I  Maintaining continence- I  Transferring/Ambulation- I  Managing Meds- I  Follow up appointments reviewed:  PCP Hospital f/u appt confirmed? Yes  Scheduled to see Dr Lenord Fellers on 7/25 @ 12. Specialist Hospital f/u appt confirmed? No   Are transportation arrangements needed? No  If their condition worsens, is the pt aware to call PCP or go to the Emergency Dept.? Yes Was the patient provided with contact information for the PCP's office or ED? Yes Was to pt encouraged to call back with questions or concerns? Yes

## 2023-06-27 DIAGNOSIS — F1020 Alcohol dependence, uncomplicated: Secondary | ICD-10-CM | POA: Diagnosis not present

## 2023-06-28 DIAGNOSIS — F1020 Alcohol dependence, uncomplicated: Secondary | ICD-10-CM | POA: Diagnosis not present

## 2023-06-29 DIAGNOSIS — F1020 Alcohol dependence, uncomplicated: Secondary | ICD-10-CM | POA: Diagnosis not present

## 2023-06-30 ENCOUNTER — Encounter: Payer: Self-pay | Admitting: Internal Medicine

## 2023-06-30 ENCOUNTER — Ambulatory Visit: Payer: BC Managed Care – PPO | Admitting: Internal Medicine

## 2023-06-30 VITALS — BP 128/76 | HR 86 | Resp 16 | Ht 69.25 in | Wt 226.5 lb

## 2023-06-30 DIAGNOSIS — I10 Essential (primary) hypertension: Secondary | ICD-10-CM | POA: Diagnosis not present

## 2023-06-30 DIAGNOSIS — F1091 Alcohol use, unspecified, in remission: Secondary | ICD-10-CM

## 2023-06-30 DIAGNOSIS — Z09 Encounter for follow-up examination after completed treatment for conditions other than malignant neoplasm: Secondary | ICD-10-CM

## 2023-06-30 DIAGNOSIS — F1090 Alcohol use, unspecified, uncomplicated: Secondary | ICD-10-CM | POA: Diagnosis not present

## 2023-06-30 DIAGNOSIS — F1020 Alcohol dependence, uncomplicated: Secondary | ICD-10-CM | POA: Diagnosis not present

## 2023-06-30 NOTE — Progress Notes (Signed)
Patient Care Team: Margaree Mackintosh, MD as PCP - General (Internal Medicine)  Visit Date: 06/30/23  Subjective:    Patient ID: Matthew Huff , Male   DOB: Jan 12, 1961, 62 y.o.    MRN: [id number]   62 y.o. Male presents today for follow up since discharge from [facility].  He has struggled with alcohol consumption since he was 17. Today he is 37 days sober. After completing his program at the [facility] he appears to be doing well. Currently he is starting a 5 week IOP program at [facility]. He plans to return to work on Monday.   He notes that he has gained some weight, was 228 lbs at the [facility]. At one point he had gained 4 lbs, and subsequently cut back on carbs. Early on during the program Librium had been increased given dyspnea and diaphoresis. He subsequently had significant fatigue/malaise for a few days before this resolved.  Currently he is on naltrexone once daily at night. He denies any side effects from naltrexone.  For exercise he has been working on increasing his walking.  He will consider receiving a COVID-19 booster vaccination this fall.  Past Medical History:  Diagnosis Date   HTN (hypertension), benign    HTN (hypertension), benign    Left inguinal hernia 02/04/2011     Family History  Problem Relation Age of Onset   Diabetes Mother    Hypertension Mother    Hyperlipidemia Mother    Heart disease Mother    Hypertension Father    Hyperlipidemia Father    Diverticulitis Father    Stroke Father    Hypertension Sister     Social History   Social History Narrative       Social history: He is divorced.  1 adult daughter who is married.  [place] the [place] [address] and formerly lived in [place].  Patient resides alone.  Enjoys fishing.  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  Social alcohol consumption.       Family history: Mother with history of diabetes and coronary artery disease  died of congestive heart failure complications.  Father deceased with history of hypertension, atherosclerotic cardiovascular disease and dementia.  1 sister overweight.              Review of Systems  Constitutional:  Negative for fever and malaise/fatigue.  HENT:  Negative for congestion.   Eyes:  Negative for blurred vision.  Respiratory:  Negative for cough and shortness of breath.   Cardiovascular:  Negative for chest pain, palpitations and leg swelling.  Gastrointestinal:  Negative for vomiting.  Musculoskeletal:  Negative for back pain.  Skin:  Negative for rash.  Neurological:  Negative for loss of consciousness and headaches.        Objective:   Vitals: BP 128/76   Pulse 86   Resp 16   Ht 5' 9.25" (1.759 m)   Wt 226 lb 8 oz (102.7 kg)   SpO2 97%   BMI 33.21 kg/m    Physical Exam Vitals and nursing note reviewed.  Constitutional:      General: He is not in acute distress.    Appearance: Normal appearance. He is not ill-appearing.  HENT:     Head: Normocephalic and atraumatic.  Pulmonary:     Effort: Pulmonary effort is normal.  Skin:    General: Skin is warm and dry.  Neurological:     Mental Status:  He is alert and oriented to person, place, and time. Mental status is at baseline.  Psychiatric:        Mood and Affect: Mood normal.        Behavior: Behavior normal.        Thought Content: Thought content normal.        Judgment: Judgment normal.       Results:   Studies obtained and personally reviewed by me:   Labs:       Component Value Date/Time   NA 139 01/13/2023 0925   K 3.5 01/13/2023 0925   CL 99 01/13/2023 0925   CO2 27 01/13/2023 0925   GLUCOSE 87 01/13/2023 0925   BUN 8 01/13/2023 0925   CREATININE 0.72 01/13/2023 0925   CALCIUM 9.8 01/13/2023 0925   PROT 7.0 01/13/2023 0925   ALBUMIN 5.1 06/13/2017 0902   AST 22 01/13/2023 0925   ALT 22 01/13/2023 0925   ALKPHOS 43 06/13/2017 0902   BILITOT 0.9 01/13/2023 0925    GFRNONAA 107 01/05/2021 1134   GFRAA 124 01/05/2021 1134     Lab Results  Component Value Date   WBC 6.1 01/13/2023   HGB 15.2 01/13/2023   HCT 43.2 01/13/2023   MCV 94.7 01/13/2023   PLT 220 01/13/2023    Lab Results  Component Value Date   CHOL 215 (H) 01/13/2023   HDL 125 01/13/2023   LDLCALC 77 01/13/2023   TRIG 45 01/13/2023   CHOLHDL 1.7 01/13/2023    No results found for: "HGBA1C"   No results found for: "TSH"   Lab Results  Component Value Date   PSA 2.48 01/13/2023   PSA 2.87 01/11/2022   PSA 3.52 01/05/2021      Assessment & Plan:   Alcohol use disorder - He was recently discharged from [facility]. At this time he is 37 days sober. Currently on naltrexone 50 mg daily which he takes at night, as well as trazodone 50 mg at bedtime. After discussion we agreed to continue this regimen for at least 1-2 months. Asked him to discuss his naltrexone with the provider at the IOP program. He is beginning a 5 week IOP program with [facility]. He plans to return to work on Monday. He will need to find a provider who can continue prescribing Naltrexone as an outpatient.  HTN- stable on current regimen of amlodipine and losartan-HCTZ  HLD- currently not on statin medication  Musculoskeletal pain treated with prn Mobic  Vaccine counseling - We discussed receiving a COVID-19 booster vaccination this Fall, which he will consider.  Return for follow-up visit  here August 22  I,[name],acting as a Neurosurgeon for Margaree Mackintosh, MD.,have documented all relevant documentation on the behalf of Margaree Mackintosh, MD,as directed by  Margaree Mackintosh, MD while in the presence of Margaree Mackintosh, MD.   I, Margaree Mackintosh, MD, have reviewed all documentation for this visit. The documentation on 07/01/23 for the exam, diagnosis, procedures, and orders are all accurate and complete.

## 2023-07-01 DIAGNOSIS — F1091 Alcohol use, unspecified, in remission: Secondary | ICD-10-CM | POA: Insufficient documentation

## 2023-07-01 NOTE — Patient Instructions (Signed)
Patient will need to find a provider who can prescribe Naltrxone. May return to work next week and continue attending out-patient meetings for alcohol use disorder counseling.

## 2023-07-07 DIAGNOSIS — Z0289 Encounter for other administrative examinations: Secondary | ICD-10-CM

## 2023-07-11 DIAGNOSIS — F1020 Alcohol dependence, uncomplicated: Secondary | ICD-10-CM | POA: Diagnosis not present

## 2023-07-12 ENCOUNTER — Other Ambulatory Visit: Payer: Self-pay | Admitting: Internal Medicine

## 2023-07-12 DIAGNOSIS — F1020 Alcohol dependence, uncomplicated: Secondary | ICD-10-CM | POA: Diagnosis not present

## 2023-07-13 DIAGNOSIS — F1020 Alcohol dependence, uncomplicated: Secondary | ICD-10-CM | POA: Diagnosis not present

## 2023-07-14 DIAGNOSIS — F1020 Alcohol dependence, uncomplicated: Secondary | ICD-10-CM | POA: Diagnosis not present

## 2023-07-14 DIAGNOSIS — Z79891 Long term (current) use of opiate analgesic: Secondary | ICD-10-CM | POA: Diagnosis not present

## 2023-07-14 DIAGNOSIS — F1120 Opioid dependence, uncomplicated: Secondary | ICD-10-CM | POA: Diagnosis not present

## 2023-07-14 DIAGNOSIS — G4700 Insomnia, unspecified: Secondary | ICD-10-CM | POA: Diagnosis not present

## 2023-07-18 ENCOUNTER — Telehealth: Payer: Self-pay | Admitting: Internal Medicine

## 2023-07-18 DIAGNOSIS — F1020 Alcohol dependence, uncomplicated: Secondary | ICD-10-CM | POA: Diagnosis not present

## 2023-07-18 NOTE — Telephone Encounter (Signed)
Received Short Term Disability papers to complete and fax back. They were completed and faxed back thru the release tab on 07/06/2023

## 2023-07-19 ENCOUNTER — Ambulatory Visit: Payer: BC Managed Care – PPO | Admitting: Internal Medicine

## 2023-07-19 DIAGNOSIS — F1020 Alcohol dependence, uncomplicated: Secondary | ICD-10-CM | POA: Diagnosis not present

## 2023-07-20 DIAGNOSIS — F1020 Alcohol dependence, uncomplicated: Secondary | ICD-10-CM | POA: Diagnosis not present

## 2023-07-21 DIAGNOSIS — Z79899 Other long term (current) drug therapy: Secondary | ICD-10-CM | POA: Diagnosis not present

## 2023-07-21 DIAGNOSIS — F1020 Alcohol dependence, uncomplicated: Secondary | ICD-10-CM | POA: Diagnosis not present

## 2023-07-21 DIAGNOSIS — G4700 Insomnia, unspecified: Secondary | ICD-10-CM | POA: Diagnosis not present

## 2023-07-21 NOTE — Progress Notes (Signed)
Patient Care Team: Margaree Mackintosh, MD as PCP - General (Internal Medicine)  Visit Date: 07/28/23  Subjective:    Patient ID: Matthew Huff , Male   DOB: 11-20-1961, 61 y.o.    MRN: [id number]   62 y.o. Male presents today for a 1 month follow-up.  He has struggled with alcohol consumption since he was 17. Today he is 65 days sober. He completed his program at the [facility] and is now participating in a 5 week IOP program at [facility]. He is feeling good about his progress and does not feel the urge to drink alcohol. He would like to see someone who could refill naltrexone 50 mg daily.  History of benign hypertension treated with Norvasc 5 mg daily, losartan-HCTZ 100-25 mg daily. Blood pressure normal today at 110/80.  History of arthritis treated with Mobic 15 mg daily. Reports receiving knee injections in the past.  Taking trazodone 50 mg for sleep.  From 01/13/23 blood work: PSA normal at 2.48 on 01/13/23, down from 2.87 on 01/11/22, MCH elevated at 3.33, glucose normal, kidney, liver functions normal, electrolytes normal, blood proteins normal, lipid panel normal.  Social history: He is divorced.  1 adult daughter who is married and lives in the [place].  Patient resides alone.  Enjoys fishing.  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  Non-smoker.  Social alcohol consumption.  Family history: Mother with history of diabetes and coronary artery disease died of congestive heart failure complications.  Father deceased with history of hypertension atherosclerotic cardiovascular disease and dementia.  1 sister overweight.   Past Medical History:  Diagnosis Date   HTN (hypertension), benign    HTN (hypertension), benign    Left inguinal hernia 02/04/2011     Family History  Problem Relation Age of Onset   Diabetes Mother    Hypertension Mother    Hyperlipidemia Mother    Heart disease Mother    Hypertension Father    Hyperlipidemia  Father    Diverticulitis Father    Stroke Father    Hypertension Sister     Social History   Social History Narrative       Social history: He is divorced.  1 adult daughter who is married.  [place] [address] and formerly lived in [place].  Patient resides alone.  Enjoys fishing.  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  Social alcohol consumption.       Family history: Mother with history of diabetes and coronary artery disease died of congestive heart failure complications.  Father deceased with history of hypertension, atherosclerotic cardiovascular disease and dementia.  1 sister overweight.              Review of Systems  Constitutional:  Negative for fever and malaise/fatigue.  HENT:  Negative for congestion.   Eyes:  Negative for blurred vision.  Respiratory:  Negative for cough and shortness of breath.   Cardiovascular:  Negative for chest pain, palpitations and leg swelling.  Gastrointestinal:  Negative for vomiting.  Musculoskeletal:  Negative for back pain.  Skin:  Negative for rash.  Neurological:  Negative for loss of consciousness and headaches.        Objective:   Vitals: BP 110/80   Pulse 83   Ht 5' 9.25" (1.759 m)   Wt 227 lb (103 kg)   SpO2 98%   BMI 33.28 kg/m    Physical  Exam Vitals and nursing note reviewed.  Constitutional:      General: He is not in acute distress.    Appearance: Normal appearance. He is not ill-appearing.  HENT:     Head: Normocephalic and atraumatic.     Right Ear: Hearing, tympanic membrane, ear canal and external ear normal.     Left Ear: Hearing, tympanic membrane, ear canal and external ear normal.     Mouth/Throat:     Pharynx: Oropharynx is clear. No oropharyngeal exudate.  Cardiovascular:     Rate and Rhythm: Normal rate and regular rhythm.     Pulses: Normal pulses.     Heart sounds: Normal heart sounds. No murmur heard.    No friction rub. No gallop.   Pulmonary:     Effort: Pulmonary effort is normal. No respiratory distress.     Breath sounds: Normal breath sounds. No wheezing or rales.  Skin:    General: Skin is warm and dry.  Neurological:     Mental Status: He is alert and oriented to person, place, and time. Mental status is at baseline.  Psychiatric:        Mood and Affect: Mood normal.        Behavior: Behavior normal.        Thought Content: Thought content normal.        Judgment: Judgment normal.       Results:   Studies obtained and personally reviewed by me:   Labs:       Component Value Date/Time   NA 139 01/13/2023 0925   K 3.5 01/13/2023 0925   CL 99 01/13/2023 0925   CO2 27 01/13/2023 0925   GLUCOSE 87 01/13/2023 0925   BUN 8 01/13/2023 0925   CREATININE 0.72 01/13/2023 0925   CALCIUM 9.8 01/13/2023 0925   PROT 7.0 01/13/2023 0925   ALBUMIN 5.1 06/13/2017 0902   AST 22 01/13/2023 0925   ALT 22 01/13/2023 0925   ALKPHOS 43 06/13/2017 0902   BILITOT 0.9 01/13/2023 0925   GFRNONAA 107 01/05/2021 1134   GFRAA 124 01/05/2021 1134     Lab Results  Component Value Date   WBC 6.1 01/13/2023   HGB 15.2 01/13/2023   HCT 43.2 01/13/2023   MCV 94.7 01/13/2023   PLT 220 01/13/2023    Lab Results  Component Value Date   CHOL 215 (H) 01/13/2023   HDL 125 01/13/2023   LDLCALC 77 01/13/2023   TRIG 45 01/13/2023   CHOLHDL 1.7 01/13/2023    No results found for: "HGBA1C"   No results found for: "TSH"   Lab Results  Component Value Date   PSA 2.48 01/13/2023   PSA 2.87 01/11/2022   PSA 3.52 01/05/2021      Assessment & Plan:   Alcohol Use Disorder: currently on naltrexone 50 mg daily which he takes at night two hours before bedtime (8:30pm), as well as trazodone 50 mg at bedtime. He has been participating in a 5 week IOP program at [facility]. Given contact information for clinic that prescribes  naltrexone.   Hypertension: treated with Norvasc 5 mg daily, losartan-HCTZ 100-25 mg daily.  Blood pressure normal today at 110/80.  Musculoskeletal pain: treated with Mobic 15 mg daily. Has done knee injections in the past.  Need to see orthopedist.  Recommend flu vaccine  Vaccine counseling: he is agreeable to getting flu vaccine.  Plan: Back at work and currently doing well with intensive outpatient therapy program.  Recommend center that deals  with naltrexone therapy for him to seek care.  I,Alexander Ruley,acting as a Neurosurgeon for Margaree Mackintosh, MD.,have documented all relevant documentation on the behalf of Margaree Mackintosh, MD,as directed by  Margaree Mackintosh, MD while in the presence of Margaree Mackintosh, MD.   I, Margaree Mackintosh, MD, have reviewed all documentation for this visit. The documentation on 08/06/23 for the exam, diagnosis, procedures, and orders are all accurate and complete.

## 2023-07-25 DIAGNOSIS — F1020 Alcohol dependence, uncomplicated: Secondary | ICD-10-CM | POA: Diagnosis not present

## 2023-07-26 DIAGNOSIS — F1020 Alcohol dependence, uncomplicated: Secondary | ICD-10-CM | POA: Diagnosis not present

## 2023-07-27 DIAGNOSIS — F1020 Alcohol dependence, uncomplicated: Secondary | ICD-10-CM | POA: Diagnosis not present

## 2023-07-28 ENCOUNTER — Ambulatory Visit: Payer: BC Managed Care – PPO | Admitting: Internal Medicine

## 2023-07-28 ENCOUNTER — Encounter: Payer: Self-pay | Admitting: Internal Medicine

## 2023-07-28 VITALS — BP 110/80 | HR 83 | Ht 69.25 in | Wt 227.0 lb

## 2023-07-28 DIAGNOSIS — Z09 Encounter for follow-up examination after completed treatment for conditions other than malignant neoplasm: Secondary | ICD-10-CM

## 2023-07-28 DIAGNOSIS — I10 Essential (primary) hypertension: Secondary | ICD-10-CM | POA: Diagnosis not present

## 2023-07-28 DIAGNOSIS — M7918 Myalgia, other site: Secondary | ICD-10-CM | POA: Diagnosis not present

## 2023-07-28 DIAGNOSIS — F1090 Alcohol use, unspecified, uncomplicated: Secondary | ICD-10-CM

## 2023-07-28 DIAGNOSIS — F1020 Alcohol dependence, uncomplicated: Secondary | ICD-10-CM | POA: Diagnosis not present

## 2023-08-01 DIAGNOSIS — F1020 Alcohol dependence, uncomplicated: Secondary | ICD-10-CM | POA: Diagnosis not present

## 2023-08-02 DIAGNOSIS — F1020 Alcohol dependence, uncomplicated: Secondary | ICD-10-CM | POA: Diagnosis not present

## 2023-08-03 DIAGNOSIS — F1020 Alcohol dependence, uncomplicated: Secondary | ICD-10-CM | POA: Diagnosis not present

## 2023-08-04 DIAGNOSIS — Z79899 Other long term (current) drug therapy: Secondary | ICD-10-CM | POA: Diagnosis not present

## 2023-08-04 DIAGNOSIS — Z79891 Long term (current) use of opiate analgesic: Secondary | ICD-10-CM | POA: Diagnosis not present

## 2023-08-04 DIAGNOSIS — G4700 Insomnia, unspecified: Secondary | ICD-10-CM | POA: Diagnosis not present

## 2023-08-04 DIAGNOSIS — F1020 Alcohol dependence, uncomplicated: Secondary | ICD-10-CM | POA: Diagnosis not present

## 2023-08-04 DIAGNOSIS — F1120 Opioid dependence, uncomplicated: Secondary | ICD-10-CM | POA: Diagnosis not present

## 2023-08-06 NOTE — Patient Instructions (Signed)
Continue trazodone as prescribed.  Continue intensive outpatient therapy program as recommended by [facility].  Blood pressure stable on current medications here today.  May need to see orthopedist regarding knee pain.  May continue with Mobic 15 mg daily.  Recommend flu vaccine.  [facility] may be a good option for naltrexone therapy.  We are glad to hear you are back at work and doing well.

## 2023-08-09 DIAGNOSIS — F1020 Alcohol dependence, uncomplicated: Secondary | ICD-10-CM | POA: Diagnosis not present

## 2023-09-01 DIAGNOSIS — F1120 Opioid dependence, uncomplicated: Secondary | ICD-10-CM | POA: Diagnosis not present

## 2023-09-01 DIAGNOSIS — Z79891 Long term (current) use of opiate analgesic: Secondary | ICD-10-CM | POA: Diagnosis not present

## 2023-09-01 DIAGNOSIS — F1020 Alcohol dependence, uncomplicated: Secondary | ICD-10-CM | POA: Diagnosis not present

## 2023-10-14 ENCOUNTER — Other Ambulatory Visit: Payer: Self-pay | Admitting: Internal Medicine

## 2023-10-27 DIAGNOSIS — G4700 Insomnia, unspecified: Secondary | ICD-10-CM | POA: Diagnosis not present

## 2023-10-27 DIAGNOSIS — F1120 Opioid dependence, uncomplicated: Secondary | ICD-10-CM | POA: Diagnosis not present

## 2023-10-27 DIAGNOSIS — Z79899 Other long term (current) drug therapy: Secondary | ICD-10-CM | POA: Diagnosis not present

## 2023-10-27 DIAGNOSIS — Z79891 Long term (current) use of opiate analgesic: Secondary | ICD-10-CM | POA: Diagnosis not present

## 2023-10-27 DIAGNOSIS — F1020 Alcohol dependence, uncomplicated: Secondary | ICD-10-CM | POA: Diagnosis not present

## 2023-11-07 ENCOUNTER — Encounter: Payer: Self-pay | Admitting: Internal Medicine

## 2023-11-07 ENCOUNTER — Ambulatory Visit (INDEPENDENT_AMBULATORY_CARE_PROVIDER_SITE_OTHER): Payer: BC Managed Care – PPO | Admitting: Internal Medicine

## 2023-11-07 VITALS — BP 100/70 | HR 88 | Ht 69.25 in | Wt 241.0 lb

## 2023-11-07 DIAGNOSIS — F1021 Alcohol dependence, in remission: Secondary | ICD-10-CM | POA: Diagnosis not present

## 2023-11-07 DIAGNOSIS — I10 Essential (primary) hypertension: Secondary | ICD-10-CM | POA: Diagnosis not present

## 2023-11-07 DIAGNOSIS — M549 Dorsalgia, unspecified: Secondary | ICD-10-CM

## 2023-11-07 MED ORDER — PREDNISONE 10 MG PO TABS: ORAL_TABLET | ORAL | 0 refills | Status: DC

## 2023-11-07 MED ORDER — CYCLOBENZAPRINE HCL 10 MG PO TABS: 10.0000 mg | ORAL_TABLET | Freq: Three times a day (TID) | ORAL | 0 refills | Status: AC | PRN

## 2023-11-07 MED ORDER — IBUPROFEN 800 MG PO TABS: 800.0000 mg | ORAL_TABLET | Freq: Three times a day (TID) | ORAL | 0 refills | Status: DC | PRN

## 2023-11-07 NOTE — Patient Instructions (Signed)
I believe you have musculoskeletal pain of your upper mid back likely due to work activity or some heavy lifting at home.  Prescribed prednisone 10 mg tapering course starting with 6 tabs day 1 and decreasing by 1 tablet daily.  Once that is completed may take ibuprofen 800 mg every 8 hours as needed.  Also can take cyclobenzaprine (Flexeril) 10 mg up to 3 times daily as needed but prefer mainly that you just take this once daily.  Continue to monitor blood pressure.  It is low today and you may stop amlodipine.  Take losartan/HCTZ 100/25 daily.  Flu vaccine given today.  Continue trazodone.  We can prescribe physical therapy if you are not improving.

## 2023-11-07 NOTE — Progress Notes (Addendum)
Patient Care Team: Margaree Mackintosh, MD as PCP - General (Internal Medicine)  Visit Date: 11/07/23  Subjective:    Patient ID: Matthew Huff , Male   DOB: 12-07-1960, 62 y.o.    MRN: [id number]   62 y.o. Male presents today for pain in upper back that he believes is muscular. Upper back muscles feel tight in certain positions. He was using a chainsaw and pain started one week later.  History of hypertension treated with amlodipine 5 mg daily. Blood pressure at 120 systolic on manual recheck.  Past Medical History:  Diagnosis Date   HTN (hypertension), benign    HTN (hypertension), benign    Left inguinal hernia 02/04/2011     Family History  Problem Relation Age of Onset   Diabetes Mother    Hypertension Mother    Hyperlipidemia Mother    Heart disease Mother    Hypertension Father    Hyperlipidemia Father    Diverticulitis Father    Stroke Father    Hypertension Sister     Social History   Social History Narrative       Social history: He is divorced.  1 adult daughter who is married.  [place] [address] and formerly lived in [place].  Patient resides alone.  Enjoys fishing.  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  Social alcohol consumption.       Family history: Mother with history of diabetes and coronary artery disease died of congestive heart failure complications.  Father deceased with history of hypertension, atherosclerotic cardiovascular disease and dementia.  1 sister overweight.              Review of Systems  Constitutional:  Negative for fever and malaise/fatigue.  HENT:  Negative for congestion.   Eyes:  Negative for blurred vision.  Respiratory:  Negative for cough and shortness of breath.   Cardiovascular:  Negative for chest pain, palpitations and leg swelling.  Gastrointestinal:  Negative for vomiting.  Musculoskeletal:  Positive for myalgias (Upper back). Negative for back pain.   Skin:  Negative for rash.  Neurological:  Negative for loss of consciousness and headaches.        Objective:   Vitals: BP 100/70   Pulse 88   Ht 5' 9.25" (1.759 m)   Wt 241 lb (109.3 kg)   SpO2 96%   BMI 35.33 kg/m    Physical Exam Vitals and nursing note reviewed.  Constitutional:      General: He is not in acute distress.    Appearance: Normal appearance. He is not ill-appearing.  HENT:     Head: Normocephalic and atraumatic.  Pulmonary:     Effort: Pulmonary effort is normal.  Musculoskeletal:     Comments: Tenderness to palpation between scapulae.  Skin:    General: Skin is warm and dry.  Neurological:     Mental Status: He is alert and oriented to person, place, and time. Mental status is at baseline.  Psychiatric:        Mood and Affect: Mood normal.        Behavior: Behavior normal.        Thought Content: Thought content normal.        Judgment: Judgment normal.       Results:   Studies obtained and personally reviewed by me:   Labs:       Component Value Date/Time   NA 139 01/13/2023 0925  K 3.5 01/13/2023 0925   CL 99 01/13/2023 0925   CO2 27 01/13/2023 0925   GLUCOSE 87 01/13/2023 0925   BUN 8 01/13/2023 0925   CREATININE 0.72 01/13/2023 0925   CALCIUM 9.8 01/13/2023 0925   PROT 7.0 01/13/2023 0925   ALBUMIN 5.1 06/13/2017 0902   AST 22 01/13/2023 0925   ALT 22 01/13/2023 0925   ALKPHOS 43 06/13/2017 0902   BILITOT 0.9 01/13/2023 0925   GFRNONAA 107 01/05/2021 1134   GFRAA 124 01/05/2021 1134     Lab Results  Component Value Date   WBC 6.1 01/13/2023   HGB 15.2 01/13/2023   HCT 43.2 01/13/2023   MCV 94.7 01/13/2023   PLT 220 01/13/2023    Lab Results  Component Value Date   CHOL 215 (H) 01/13/2023   HDL 125 01/13/2023   LDLCALC 77 01/13/2023   TRIG 45 01/13/2023   CHOLHDL 1.7 01/13/2023    No results found for: "HGBA1C"   No results found for: "TSH"   Lab Results  Component Value Date   PSA 2.48 01/13/2023    PSA 2.87 01/11/2022   PSA 3.52 01/05/2021      Assessment & Plan:   Myalgias upper back: advised on alternating heat and cold therapy. Prescribed prednisone tapering course, ibuprofen 800 mg every eight hours as needed, cyclobenzaprine 10 mg three times daily as needed. Advised not to take steroid and ibuprofen concurrently.  Should try prednisone taper first and then ibuprofen after that course is completed.  Could prescribe physical therapy if symptoms persist.  Likely strained his upper back musculature with some activity at work or perhaps using a chainsaw recently.  Hypertension: stop amlodipine.  Continue to monitor blood pressure twice daily.  Take losartan/HCTZ 100/25 daily.  Vaccine counseling: administered flu booster today.  Alcohol use disorder-continues to do well and is working full-time.  Continues to meet with sponsor.  Spending time with family.  Remains on trazodone.    I,Alexander Ruley,acting as a Neurosurgeon for Margaree Mackintosh, MD.,have documented all relevant documentation on the behalf of Margaree Mackintosh, MD,as directed by  Margaree Mackintosh, MD while in the presence of Margaree Mackintosh, MD.   I, Margaree Mackintosh, MD, have reviewed all documentation for this visit. The documentation on 11/07/23 for the exam, diagnosis, procedures, and orders are all accurate and complete.

## 2024-01-19 DIAGNOSIS — F1020 Alcohol dependence, uncomplicated: Secondary | ICD-10-CM | POA: Diagnosis not present

## 2024-01-25 ENCOUNTER — Telehealth: Payer: Self-pay | Admitting: Internal Medicine

## 2024-01-25 NOTE — Telephone Encounter (Signed)
 LVM that we were not opening until 10:00, he can come then or a different morning since his CPE is not until Tuesday

## 2024-01-26 ENCOUNTER — Other Ambulatory Visit: Payer: BC Managed Care – PPO

## 2024-01-26 DIAGNOSIS — M7918 Myalgia, other site: Secondary | ICD-10-CM

## 2024-01-26 DIAGNOSIS — Z0000 Encounter for general adult medical examination without abnormal findings: Secondary | ICD-10-CM

## 2024-01-26 DIAGNOSIS — E7800 Pure hypercholesterolemia, unspecified: Secondary | ICD-10-CM

## 2024-01-26 DIAGNOSIS — Z125 Encounter for screening for malignant neoplasm of prostate: Secondary | ICD-10-CM

## 2024-01-26 DIAGNOSIS — I10 Essential (primary) hypertension: Secondary | ICD-10-CM

## 2024-01-30 ENCOUNTER — Other Ambulatory Visit: Payer: BC Managed Care – PPO

## 2024-01-30 DIAGNOSIS — Z125 Encounter for screening for malignant neoplasm of prostate: Secondary | ICD-10-CM

## 2024-01-30 DIAGNOSIS — I10 Essential (primary) hypertension: Secondary | ICD-10-CM

## 2024-01-30 DIAGNOSIS — M7918 Myalgia, other site: Secondary | ICD-10-CM | POA: Diagnosis not present

## 2024-01-30 DIAGNOSIS — Z0000 Encounter for general adult medical examination without abnormal findings: Secondary | ICD-10-CM

## 2024-01-30 DIAGNOSIS — E7800 Pure hypercholesterolemia, unspecified: Secondary | ICD-10-CM | POA: Diagnosis not present

## 2024-01-31 ENCOUNTER — Ambulatory Visit: Payer: BC Managed Care – PPO | Admitting: Internal Medicine

## 2024-01-31 VITALS — BP 102/80 | HR 82 | Ht 69.25 in | Wt 227.0 lb

## 2024-01-31 DIAGNOSIS — Z0000 Encounter for general adult medical examination without abnormal findings: Secondary | ICD-10-CM | POA: Diagnosis not present

## 2024-01-31 DIAGNOSIS — M7918 Myalgia, other site: Secondary | ICD-10-CM | POA: Diagnosis not present

## 2024-01-31 DIAGNOSIS — F1090 Alcohol use, unspecified, uncomplicated: Secondary | ICD-10-CM

## 2024-01-31 DIAGNOSIS — F1021 Alcohol dependence, in remission: Secondary | ICD-10-CM | POA: Diagnosis not present

## 2024-01-31 DIAGNOSIS — I10 Essential (primary) hypertension: Secondary | ICD-10-CM | POA: Diagnosis not present

## 2024-01-31 DIAGNOSIS — E7800 Pure hypercholesterolemia, unspecified: Secondary | ICD-10-CM

## 2024-01-31 LAB — CBC WITH DIFFERENTIAL/PLATELET
Absolute Lymphocytes: 1436 {cells}/uL (ref 850–3900)
Absolute Monocytes: 584 {cells}/uL (ref 200–950)
Basophils Absolute: 50 {cells}/uL (ref 0–200)
Basophils Relative: 0.5 %
Eosinophils Absolute: 89 {cells}/uL (ref 15–500)
Eosinophils Relative: 0.9 %
HCT: 46.9 % (ref 38.5–50.0)
Hemoglobin: 15.8 g/dL (ref 13.2–17.1)
MCH: 30.8 pg (ref 27.0–33.0)
MCHC: 33.7 g/dL (ref 32.0–36.0)
MCV: 91.4 fL (ref 80.0–100.0)
MPV: 9.8 fL (ref 7.5–12.5)
Monocytes Relative: 5.9 %
Neutro Abs: 7742 {cells}/uL (ref 1500–7800)
Neutrophils Relative %: 78.2 %
Platelets: 243 10*3/uL (ref 140–400)
RBC: 5.13 Million/uL (ref 4.20–5.80)
RDW: 12.6 % (ref 11.0–15.0)
Total Lymphocyte: 14.5 %
WBC: 9.9 10*3/uL (ref 3.8–10.8)

## 2024-01-31 LAB — COMPLETE METABOLIC PANEL WITH GFR
AG Ratio: 2.3 (calc) (ref 1.0–2.5)
ALT: 9 U/L (ref 9–46)
AST: 11 U/L (ref 10–35)
Albumin: 4.2 g/dL (ref 3.6–5.1)
Alkaline phosphatase (APISO): 63 U/L (ref 35–144)
BUN: 14 mg/dL (ref 7–25)
CO2: 24 mmol/L (ref 20–32)
Calcium: 9.4 mg/dL (ref 8.6–10.3)
Chloride: 105 mmol/L (ref 98–110)
Creat: 0.83 mg/dL (ref 0.70–1.35)
Globulin: 1.8 g/dL — ABNORMAL LOW (ref 1.9–3.7)
Glucose, Bld: 97 mg/dL (ref 65–99)
Potassium: 4 mmol/L (ref 3.5–5.3)
Sodium: 139 mmol/L (ref 135–146)
Total Bilirubin: 0.5 mg/dL (ref 0.2–1.2)
Total Protein: 6 g/dL — ABNORMAL LOW (ref 6.1–8.1)
eGFR: 100 mL/min/{1.73_m2} (ref >=60)

## 2024-01-31 LAB — LIPID PANEL
Cholesterol: 177 mg/dL (ref <200)
HDL: 55 mg/dL (ref >=40)
LDL Cholesterol (Calc): 108 mg/dL — ABNORMAL HIGH
Non-HDL Cholesterol (Calc): 122 mg/dL (ref <130)
Total CHOL/HDL Ratio: 3.2 (calc) (ref <5.0)
Triglycerides: 58 mg/dL (ref <150)

## 2024-01-31 LAB — POCT URINALYSIS DIP (CLINITEK)
Bilirubin, UA: NEGATIVE
Blood, UA: NEGATIVE
Glucose, UA: NEGATIVE mg/dL
Ketones, POC UA: NEGATIVE mg/dL
Leukocytes, UA: NEGATIVE
Nitrite, UA: NEGATIVE
POC PROTEIN,UA: NEGATIVE
Spec Grav, UA: 1.01 (ref 1.010–1.025)
Urobilinogen, UA: 0.2 U/dL
pH, UA: 6.5 (ref 5.0–8.0)

## 2024-01-31 LAB — PSA: PSA: 1.69 ng/mL (ref <=4.00)

## 2024-01-31 NOTE — Progress Notes (Signed)
 Annual Wellness Visit   Patient Care Team: Margaree Mackintosh, MD as PCP - General (Internal Medicine)  Visit Date: 01/31/24   Chief Complaint  Patient presents with   Annual Exam   Subjective:  Patient: Matthew Huff, Male DOB: 01-10-1961, 63 y.o. MRN: [id number] Surya Schroeter is a 63 y.o. Male who presents today for his Annual Wellness Visit. Patient has history of HTN (Hypertension); Left Shoulder Pain; Left Inguinal Hernia; Alcohol Use Disorder In Remission; and High Serum High Density Lipoprotein (HDL).   History of Hypertension treated with Amlodipine 5 mg daily, Losartan-HCTZ 100-25 mg daily. History of Office Hypertension, Blood Pressure: normotensive today at 102/80.    History of Hyperlipidemia with 01/31/2024 Lipid Panel, compared to 01/13/2023: LDL 108, elevated from 77. Previously has taken Lipitor, but was able to discontinue this after losing weight.    History of Arthritis treated with Mobic 15 mg daily and 10 mg Flexeril daily. Per prior reports he has recieved knee injections in the past.   History of Vitamin-D Deficiency treated with Vitamin D 1,000 units daily and Calcium Carbonate-Vitamin-D 600-400 mg daily.  History of Alcohol Use Disorder, in remission for the past 8 months (just recently received his coin he reports). States that he decided to quit after hearing stories about other's experiences with alcohol withdrawal and has been going to meetings per his sponsor instructions.   Labs 01/31/2024 CBC: WNL CMP, compared to 01/14/2023: Total Protein 6.0, decreased from 7.0; Globulin 1.8, decreased from 2.2.   Colonoscopy discussed.  PSA 1.69 01/30/2024    Vaccine Counseling: UTD on Flu, PNA, and Tdap. Past Medical History:  Diagnosis Date   HTN (hypertension), benign    HTN (hypertension), benign    Left inguinal hernia 02/04/2011  Medical/Surgical History Narrative:  2022 - Mild COVID-19 illness in January and recovered w/o Sequela.  2018 - hx  of Left Knee Pain seen by Dr. Charlett Blake in February. Report indicated he likely had a meniscal tear; this occurred at work when he slipped. Hx of Low Back Pain w/o Sciatica; had a back injury in his 20s lasting some 8 days.  2016 - Left Shoulder Arthropathy injected by Orthopedist at [facility]  1983 - Fractured Ankle at age 63  40 - Fractured Right Arm at age 41.   Other - No known drug allergies. Family History  Problem Relation Age of Onset   Diabetes Mother    Hypertension Mother    Hyperlipidemia Mother    Heart disease Mother    Hypertension Father    Hyperlipidemia Father    Diverticulitis Father    Stroke Father    Hypertension Sister    Social History   Social History Narrative   Divorced. 1 adult daughter who is married. [place] [address], formerly lived in [place].  Patient resides alone. Enjoys fishing. 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 Social alcohol consumption.      01/31/2024 - Has been taking care of a puppy for a little while now for someone, and is now thinking about getting one himself for a companion. 8 months sober.       Family history: Mother with history of diabetes and coronary artery disease died of congestive heart failure complications.  Father deceased with history of hypertension, atherosclerotic cardiovascular disease and dementia.  1 sister overweight.           Review of Systems  Constitutional:  Negative for chills, fever, malaise/fatigue  and weight loss.  HENT:  Negative for hearing loss, sinus pain and sore throat.   Respiratory:  Negative for cough, hemoptysis and shortness of breath.   Cardiovascular:  Negative for chest pain, palpitations, leg swelling and PND.  Gastrointestinal:  Negative for abdominal pain, constipation, diarrhea, heartburn, nausea and vomiting.  Genitourinary:  Negative for dysuria, frequency and urgency.  Musculoskeletal:  Negative for back pain, myalgias and  neck pain.  Skin:  Negative for itching and rash.  Neurological:  Negative for dizziness, tingling, seizures and headaches.  Endo/Heme/Allergies:  Negative for polydipsia.  Psychiatric/Behavioral:  Negative for depression. The patient is not nervous/anxious.     Objective:  Vitals: BP 102/80   Pulse 82   Ht 5' 9.25" (1.759 m)   Wt 227 lb (103 kg)   SpO2 95%   BMI 33.28 kg/m  Physical Exam Vitals and nursing note reviewed.  Constitutional:      General: He is awake. He is not in acute distress.    Appearance: Normal appearance. He is not ill-appearing or toxic-appearing.  HENT:     Head: Normocephalic and atraumatic.     Right Ear: Tympanic membrane, ear canal and external ear normal.     Left Ear: Tympanic membrane, ear canal and external ear normal.     Mouth/Throat:     Pharynx: Oropharynx is clear.  Eyes:     Extraocular Movements: Extraocular movements intact.     Pupils: Pupils are equal, round, and reactive to light.  Neck:     Thyroid: No thyroid mass, thyromegaly or thyroid tenderness.     Vascular: No carotid bruit.  Cardiovascular:     Rate and Rhythm: Normal rate and regular rhythm. No extrasystoles are present.    Pulses:          Dorsalis pedis pulses are 1+ on the right side and 1+ on the left side.     Heart sounds: Normal heart sounds. No murmur heard.    No friction rub. No gallop.  Pulmonary:     Effort: Pulmonary effort is normal.     Breath sounds: Normal breath sounds. No decreased breath sounds, wheezing, rhonchi or rales.  Chest:     Chest wall: No mass.  Abdominal:     Palpations: Abdomen is soft. There is no hepatomegaly, splenomegaly or mass.     Tenderness: There is no abdominal tenderness.     Hernia: No hernia is present.  Genitourinary:    Prostate: Normal.  Musculoskeletal:     Cervical back: Normal range of motion.     Right lower leg: Edema (trace) present.     Left lower leg: Edema (trace) present.  Lymphadenopathy:     Cervical:  No cervical adenopathy.     Upper Body:     Right upper body: No supraclavicular adenopathy.     Left upper body: No supraclavicular adenopathy.  Skin:    General: Skin is warm and dry.  Neurological:     General: No focal deficit present.     Mental Status: He is alert and oriented to person, place, and time. Mental status is at baseline.     Cranial Nerves: Cranial nerves 2-12 are intact.     Sensory: Sensation is intact.     Motor: Motor function is intact.     Coordination: Coordination is intact.     Gait: Gait is intact.     Deep Tendon Reflexes: Reflexes are normal and symmetric.  Psychiatric:  Attention and Perception: Attention normal.        Mood and Affect: Mood normal.        Speech: Speech normal.        Behavior: Behavior normal. Behavior is cooperative.        Thought Content: Thought content normal.        Cognition and Memory: Cognition and memory normal.        Judgment: Judgment normal.   Most Recent Fall Risk Assessment:    03/10/2023   11:58 AM  Fall Risk   Falls in the past year? 0  Number falls in past yr: 0  Injury with Fall? 0  Risk for fall due to : No Fall Risks  Follow up Falls prevention discussed   Most Recent Depression Screenings:    03/10/2023   11:58 AM 03/08/2023   11:59 AM  PHQ 2/9 Scores  PHQ - 2 Score 0 0   Results:  Studies Obtained And Personally Reviewed By Me: Labs:     Component Value Date/Time   NA 139 01/30/2024 0909   K 4.0 01/30/2024 0909   CL 105 01/30/2024 0909   CO2 24 01/30/2024 0909   GLUCOSE 97 01/30/2024 0909   BUN 14 01/30/2024 0909   CREATININE 0.83 01/30/2024 0909   CALCIUM 9.4 01/30/2024 0909   PROT 6.0 (L) 01/30/2024 0909   ALBUMIN 5.1 06/13/2017 0902   AST 11 01/30/2024 0909   ALT 9 01/30/2024 0909   ALKPHOS 43 06/13/2017 0902   BILITOT 0.5 01/30/2024 0909   GFRNONAA 107 01/05/2021 1134   GFRAA 124 01/05/2021 1134    Lab Results  Component Value Date   WBC 9.9 01/30/2024   HGB 15.8  01/30/2024   HCT 46.9 01/30/2024   MCV 91.4 01/30/2024   PLT 243 01/30/2024   Lab Results  Component Value Date   CHOL 177 01/30/2024   HDL 55 01/30/2024   LDLCALC 108 (H) 01/30/2024   TRIG 58 01/30/2024   CHOLHDL 3.2 01/30/2024   Lab Results  Component Value Date   PSA 1.69 01/30/2024   PSA 2.48 01/13/2023   PSA 2.87 01/11/2022     Assessment & Plan:  No orders of the defined types were placed in this encounter.  Other Labs Reviewed today: CBC: WNL CMP, compared to 01/14/2023: Total Protein 6.0, decreased from 7.0; Globulin 1.8, decreased from 2.2.   Hypertension treated with Amlodipine 5 mg daily, Losartan-HCTZ 100-25 mg daily. History of Office Hypertension, Blood Pressure: normotensive today at 102/80.    Hyperlipidemia with 01/31/2024 Lipid Panel, compared to 01/13/2023: LDL 108, elevated from 77. Previously has taken Lipitor, but was able to discontinue this after losing weight.    Musculoskeletal pain treated with Mobic 15 mg daily and 10 mg Flexeril daily. Per prior reports he has recieved knee injections in the past.   Vitamin-D Deficiency treated with Vitamin D 1,000 units daily and Calcium Carbonate-Vitamin-D 600-400 mg daily.  Alcohol Use Disorder in Recovery for the past 8 months (just recently received his coin he reports). States that he decided to quit after hearing stories about other's experiences with alcohol withdrawal and has been going to meetings per his sponsor instructions.   Colonoscopy discussed.  PSA 1.69 01/30/2024   Vaccine Counseling: UTD on Flu, PNA, and Tdap.   Annual wellness visit done today including the all of the following: Reviewed patient's Family Medical History Reviewed and updated list of patient's medical providers Assessment of cognitive impairment was done Assessed patient's  functional ability Established a written schedule for health screening services Health Risk Assessent Completed and Reviewed  Discussed health benefits of  physical activity, and encouraged him to engage in regular exercise appropriate for his age and condition.    I,Emily Lagle,acting as a Neurosurgeon for Margaree Mackintosh, MD.,have documented all relevant documentation on the behalf of Margaree Mackintosh, MD,as directed by  Margaree Mackintosh, MD while in the presence of Margaree Mackintosh, MD.   I, Margaree Mackintosh, MD, have reviewed all documentation for this visit. The documentation on 02/03/24 for the exam, diagnosis, procedures, and orders are all accurate and complete.

## 2024-02-03 ENCOUNTER — Encounter: Payer: Self-pay | Admitting: Internal Medicine

## 2024-02-03 NOTE — Patient Instructions (Signed)
 It was a pleasure to see you today. Referral will be made to [facility] for colonoscopy. Continue same meds and return in 6 months. We are pleased you are doing well. No change in medications. Continue diet and exercise  regimen.

## 2024-03-30 ENCOUNTER — Telehealth: Payer: Self-pay | Admitting: *Deleted

## 2024-03-30 NOTE — Telephone Encounter (Signed)
 Copied from CRM [id number]. Topic: General - Other >> Mar 29, 2024 11:04 AM Opal Bill wrote: Reason for CRM: Pt calling in to speak with Wallene Gum. Would not disclose the reason. Please contact patient.

## 2024-04-01 ENCOUNTER — Encounter: Payer: Self-pay | Admitting: Internal Medicine

## 2024-04-02 ENCOUNTER — Other Ambulatory Visit: Payer: Self-pay

## 2024-04-02 DIAGNOSIS — G4730 Sleep apnea, unspecified: Secondary | ICD-10-CM

## 2024-04-02 MED ORDER — TRAZODONE HCL 50 MG PO TABS: 50.0000 mg | ORAL_TABLET | Freq: Every day | ORAL | 0 refills | Status: DC

## 2024-04-02 NOTE — Telephone Encounter (Signed)
 Completed 04/02/2024 per Dr. Liane Redman request.

## 2024-04-02 NOTE — Telephone Encounter (Signed)
 I called patient back and he stated he has talked to Dr Liane Redman about refilling trazodone. He would like sent to [facility]  [id number] - [place], [place] - [address] AT [address]  traZODone (DESYREL)  90 d/s of 100mg .   Patient also is coming up on a year of taking naltrexone (DEPADE) 50 MG tablet and he needs to know how to wean off of it, He stated he was only suppose to be on it for a year.

## 2024-07-24 NOTE — Progress Notes (Incomplete)
 Patient Care Team: Perri Ronal PARAS, MD as PCP - General (Internal Medicine)  Visit Date: 07/24/24  Subjective:  No chief complaint on file.  There were no vitals filed for this visit. Patient PI:Matthew Huff,Male DOB:1961/09/23,63 y.o. FMW:[id number]   63 y.o.Male presents today for 6 months follow-up for hypertension and hyperlipidemia . Patient has a past medical history of Hypertension, paronychia of left middle finger, left shoulder pain, hyperlipidemia and is in remission for alcohol use disorder . Seen for annual visit on 01/31/2024, in the interim has not been seen in any office connected through [facility] or [facility].   Hypertension treated with Losartan -HTCZ - 100-25 mg daily. History of office hypertension. Blood pressure:    History of hyperlipidemia with 01/31/2024 Lipid Panel: LDL 108 elevated from 77 on 01/14/24. Was taking Lipitor but was able to discontinue after losing weight.    Arthritis treated with Mobic  15 mg daily and Flexeril  10 mg daily. Has received knee injections in the past    Vitamin-D Deficiency treated with Vitamin D 1,000 units daily and Calcium Carbonate- Vitamin D 600-400 mg daily   History of alcohol use disorder, in remission the past 8 months   Reviewed 07/24/2024 Hepatic Function Panel:   Past Medical History:  Diagnosis Date   HTN (hypertension), benign    HTN (hypertension), benign    Left inguinal hernia 02/04/2011    No Known Allergies Immunization History  Administered Date(s) Administered   Influenza Split 10/21/2011, 10/06/2012   Influenza Whole 09/18/2010   Influenza,inj,Quad PF,6+ Mos 09/26/2013, 10/29/2014, 11/09/2016, 10/21/2017, 10/06/2018, 09/26/2019, 11/05/2020, 09/23/2021, 10/26/2022   Influenza-Unspecified 07/28/2023   PFIZER(Purple Top)SARS-COV-2 Vaccination 02/11/2020, 03/03/2020, 12/11/2020   PNEUMOCOCCAL CONJUGATE-20 01/14/2023   Tdap 10/05/2002, 10/06/2012, 10/26/2022   Past Surgical History:  Procedure  Laterality Date   INCISIONAL HERNIA REPAIR  2012   INGUINAL HERNIA REPAIR  [id number]   INGUINAL HERNIA REPAIR  2012    Family History  Problem Relation Age of Onset   Diabetes Mother    Hypertension Mother    Hyperlipidemia Mother    Heart disease Mother    Hypertension Father    Hyperlipidemia Father    Diverticulitis Father    Stroke Father    Hypertension Sister    Social History   Social History Narrative   Divorced. 1 adult daughter who is married. Has moved to the mountains of [place] , formerly lived in [place].  Patient resides alone. Enjoys fishing. 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 Social alcohol consumption.      01/31/2024 - Has been taking care of a puppy for a little while now for someone, and is now thinking about getting one himself for a companion. 8 months sober.       Family history: Mother with history of diabetes and coronary artery disease died of congestive heart failure complications.  Father deceased with history of hypertension, atherosclerotic cardiovascular disease and dementia.  1 sister overweight.           Review of Systems  Constitutional:  Negative for fever and malaise/fatigue.  HENT:  Negative for congestion.   Eyes:  Negative for blurred vision.  Respiratory:  Negative for cough and shortness of breath.   Cardiovascular:  Negative for chest pain, palpitations and leg swelling.  Gastrointestinal:  Negative for vomiting.  Musculoskeletal:  Negative for back pain.  Skin:  Negative for rash.  Neurological:  Negative for loss of consciousness and headaches.     Objective:  Vitals: There  were no vitals taken for this visit.  Physical Exam Vitals and nursing note reviewed.  Constitutional:      General: He is not in acute distress.    Appearance: Normal appearance. He is not ill-appearing.  HENT:     Head: Normocephalic and atraumatic.  Pulmonary:     Effort: Pulmonary effort is normal.  Skin:     General: Skin is warm and dry.  Neurological:     Mental Status: He is alert and oriented to person, place, and time. Mental status is at baseline.  Psychiatric:        Mood and Affect: Mood normal.        Behavior: Behavior normal.        Thought Content: Thought content normal.        Judgment: Judgment normal.     Results:  Studies Obtained And Personally Reviewed By Me: Labs:  CBC w/ Differential Lab Results  Component Value Date   WBC 9.9 01/30/2024   RBC 5.13 01/30/2024   HGB 15.8 01/30/2024   HCT 46.9 01/30/2024   PLT 243 01/30/2024   MCV 91.4 01/30/2024   MCH 30.8 01/30/2024   MCHC 33.7 01/30/2024   RDW 12.6 01/30/2024   MPV 9.8 01/30/2024   LYMPHSABS 1,110 01/13/2023   MONOABS 774 06/13/2017   BASOSABS 50 01/30/2024    Comprehensive Metabolic Panel Lab Results  Component Value Date   NA 139 01/30/2024   K 4.0 01/30/2024   CL 105 01/30/2024   CO2 24 01/30/2024   GLUCOSE 97 01/30/2024   BUN 14 01/30/2024   CREATININE 0.83 01/30/2024   CALCIUM 9.4 01/30/2024   PROT 6.0 (L) 01/30/2024   ALBUMIN 5.1 06/13/2017   AST 11 01/30/2024   ALT 9 01/30/2024   ALKPHOS 43 06/13/2017   BILITOT 0.5 01/30/2024   EGFR 100 01/30/2024   GFRNONAA 107 01/05/2021   Lipid Panel  Lab Results  Component Value Date   CHOL 177 01/30/2024   HDL 55 01/30/2024   LDLCALC 108 (H) 01/30/2024   TRIG 58 01/30/2024   PSA Lab Results  Component Value Date   PSA 1.69 01/30/2024   PSA 2.48 01/13/2023   PSA 2.87 01/11/2022     No results found for any visits on 07/27/24. Assessment & Plan:  No orders of the defined types were placed in this encounter.   No orders of the defined types were placed in this encounter.   ***       I,Emily Lagle,acting as a scribe for Ronal JINNY Hailstone, MD.,have documented all relevant documentation on the behalf of Ronal JINNY Hailstone, MD,as directed by  Ronal JINNY Hailstone, MD while in the presence of Ronal JINNY Hailstone, MD.  I, Ronal JINNY Hailstone, MD, have reviewed  all documentation for this visit. The documentation on 07/27/2024 for the exam, diagnosis, procedures, and orders are all accurate and complete.

## 2024-07-26 ENCOUNTER — Other Ambulatory Visit: Payer: BC Managed Care – PPO

## 2024-07-26 DIAGNOSIS — E7800 Pure hypercholesterolemia, unspecified: Secondary | ICD-10-CM | POA: Diagnosis not present

## 2024-07-26 DIAGNOSIS — I10 Essential (primary) hypertension: Secondary | ICD-10-CM

## 2024-07-27 ENCOUNTER — Ambulatory Visit: Admitting: Internal Medicine

## 2024-07-27 ENCOUNTER — Ambulatory Visit: Payer: Self-pay | Admitting: Internal Medicine

## 2024-07-27 ENCOUNTER — Encounter: Payer: Self-pay | Admitting: Internal Medicine

## 2024-07-27 ENCOUNTER — Ambulatory Visit: Payer: BC Managed Care – PPO | Admitting: Internal Medicine

## 2024-07-27 VITALS — BP 100/78 | HR 80 | Ht 69.25 in | Wt 240.0 lb

## 2024-07-27 DIAGNOSIS — F1021 Alcohol dependence, in remission: Secondary | ICD-10-CM

## 2024-07-27 DIAGNOSIS — M79671 Pain in right foot: Secondary | ICD-10-CM

## 2024-07-27 DIAGNOSIS — E7800 Pure hypercholesterolemia, unspecified: Secondary | ICD-10-CM | POA: Diagnosis not present

## 2024-07-27 DIAGNOSIS — I10 Essential (primary) hypertension: Secondary | ICD-10-CM

## 2024-07-27 DIAGNOSIS — Z6835 Body mass index (BMI) 35.0-35.9, adult: Secondary | ICD-10-CM

## 2024-07-27 DIAGNOSIS — B351 Tinea unguium: Secondary | ICD-10-CM

## 2024-07-27 DIAGNOSIS — M79672 Pain in left foot: Secondary | ICD-10-CM

## 2024-07-27 LAB — HEPATIC FUNCTION PANEL
AG Ratio: 2.4 (calc) (ref 1.0–2.5)
ALT: 12 U/L (ref 9–46)
AST: 10 U/L (ref 10–35)
Albumin: 4.5 g/dL (ref 3.6–5.1)
Alkaline phosphatase (APISO): 64 U/L (ref 35–144)
Bilirubin, Direct: 0.1 mg/dL (ref 0.0–0.2)
Globulin: 1.9 g/dL (ref 1.9–3.7)
Indirect Bilirubin: 0.3 mg/dL (ref 0.2–1.2)
Total Bilirubin: 0.4 mg/dL (ref 0.2–1.2)
Total Protein: 6.4 g/dL (ref 6.1–8.1)

## 2024-07-27 LAB — LIPID PANEL
Cholesterol: 175 mg/dL (ref <200)
HDL: 59 mg/dL (ref >=40)
LDL Cholesterol (Calc): 102 mg/dL — ABNORMAL HIGH
Non-HDL Cholesterol (Calc): 116 mg/dL (ref <130)
Total CHOL/HDL Ratio: 3 (calc) (ref <5.0)
Triglycerides: 53 mg/dL (ref <150)

## 2024-07-27 MED ORDER — TRAZODONE HCL 100 MG PO TABS: 100.0000 mg | ORAL_TABLET | Freq: Every day | ORAL | 1 refills | Status: AC

## 2024-07-27 NOTE — Progress Notes (Signed)
 Patient Care Team: Matthew Ronal PARAS, MD as PCP - General (Internal Medicine)  Visit Date: 07/27/24  Subjective:   Chief Complaint  Patient presents with   Hyperlipidemia   Hypertension   Vitals:   07/27/24 1015  BP: 100/78   Patient PI:Matthew Huff, Higinbotham DOB:03-11-1961,63 y.o. FMW:[id number]   63 y.o.Male presents today for 6 months follow-up for hypertension and hyperlipidemia . Patient has a past medical history of Hypertension, paronychia of left middle finger, left shoulder pain, hyperlipidemia and is in remission for alcohol use disorder . Seen for annual visit on 01/31/2024, in the interim has not been seen in any office connected through [facility] or [facility]where.   He says that he's been having foot issues.  Hypertension treated with Losartan -HTCZ - 100-25 mg daily. History of office hypertension. Blood pressure: normotensive 100/78. Mentions noticing his systolic blood pressures have been on the lower side.   History of hyperlipidemia with 07/26/2024 Lipid Panel: LDL 102 decreased from 108 on 01/14/24. Was taking Lipitor but was able to discontinue after losing weight.    Arthritis treated with Mobic  15 mg daily and Flexeril  10 mg daily. Has received knee injections in the past    Vitamin-D Deficiency treated with Vitamin D 1,000 units daily and Calcium Carbonate- Vitamin D 600-400 mg daily   History of alcohol use disorder, in remission the past 8 months. Says he is still going to AA and is doing well.  Reviewed 07/24/2024 Hepatic Function Panel: WNL  Colonoscopy due.  Vaccine Counseling: Discussed Influenza, which he says he will update in the near future. Past Medical History:  Diagnosis Date   HTN (hypertension), benign    HTN (hypertension), benign    Left inguinal hernia 02/04/2011    No Known Allergies Immunization History  Administered Date(s) Administered   Influenza Split 10/21/2011, 10/06/2012   Influenza Whole 09/18/2010   Influenza,inj,Quad  PF,6+ Mos 09/26/2013, 10/29/2014, 11/09/2016, 10/21/2017, 10/06/2018, 09/26/2019, 11/05/2020, 09/23/2021, 10/26/2022   Influenza-Unspecified 07/28/2023   PFIZER(Purple Top)SARS-COV-2 Vaccination 02/11/2020, 03/03/2020, 12/11/2020   PNEUMOCOCCAL CONJUGATE-20 01/14/2023   Tdap 10/05/2002, 10/06/2012, 10/26/2022   Past Surgical History:  Procedure Laterality Date   INCISIONAL HERNIA REPAIR  2012   INGUINAL HERNIA REPAIR  [id number]   INGUINAL HERNIA REPAIR  2012    Family History  Problem Relation Age of Onset   Diabetes Mother    Hypertension Mother    Hyperlipidemia Mother    Heart disease Mother    Hypertension Father    Hyperlipidemia Father    Diverticulitis Father    Stroke Father    Hypertension Sister    Social History   Social History Narrative   Divorced. 1 adult daughter who is married. Has moved to the mountains of [place] , formerly lived in [place].  Patient resides alone. Enjoys fishing. 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 Social alcohol consumption.      01/31/2024 - Has been taking care of a puppy for a little while now for someone, and is now thinking about getting one himself for a companion. 8 months sober.       Family history: Mother with history of diabetes and coronary artery disease died of congestive heart failure complications.  Father deceased with history of hypertension, atherosclerotic cardiovascular disease and dementia.  1 sister overweight.           Review of Systems  Constitutional:  Negative for fever and malaise/fatigue.  HENT:  Negative for congestion.   Eyes:  Negative for blurred  vision.  Respiratory:  Negative for cough and shortness of breath.   Cardiovascular:  Negative for chest pain, palpitations and leg swelling.  Gastrointestinal:  Negative for vomiting.  Musculoskeletal:  Negative for back pain.  Skin:  Negative for rash.  Neurological:  Negative for loss of consciousness and headaches.      Objective:  Vitals: BP 100/78   Pulse 80   Ht 5' 9.25 (1.759 m)   Wt 240 lb (108.9 kg)   SpO2 98%   BMI 35.19 kg/m   Physical Exam Vitals and nursing note reviewed.  Constitutional:      General: He is not in acute distress.    Appearance: Normal appearance. He is not ill-appearing.  HENT:     Head: Normocephalic and atraumatic.  Pulmonary:     Effort: Pulmonary effort is normal.  Skin:    General: Skin is warm and dry.  Neurological:     Mental Status: He is alert and oriented to person, place, and time. Mental status is at baseline.  Psychiatric:        Mood and Affect: Mood normal.        Behavior: Behavior normal.        Thought Content: Thought content normal.        Judgment: Judgment normal.     Results:  Studies Obtained And Personally Reviewed By Me: Labs:  CBC w/ Differential Lab Results  Component Value Date   WBC 9.9 01/30/2024   RBC 5.13 01/30/2024   HGB 15.8 01/30/2024   HCT 46.9 01/30/2024   PLT 243 01/30/2024   MCV 91.4 01/30/2024   MCH 30.8 01/30/2024   MCHC 33.7 01/30/2024   RDW 12.6 01/30/2024   MPV 9.8 01/30/2024   LYMPHSABS 1,110 01/13/2023   MONOABS 774 06/13/2017   BASOSABS 50 01/30/2024    Comprehensive Metabolic Panel Lab Results  Component Value Date   NA 139 01/30/2024   K 4.0 01/30/2024   CL 105 01/30/2024   CO2 24 01/30/2024   GLUCOSE 97 01/30/2024   BUN 14 01/30/2024   CREATININE 0.83 01/30/2024   CALCIUM 9.4 01/30/2024   PROT 6.4 07/26/2024   ALBUMIN 5.1 06/13/2017   AST 10 07/26/2024   ALT 12 07/26/2024   ALKPHOS 43 06/13/2017   BILITOT 0.4 07/26/2024   EGFR 100 01/30/2024   GFRNONAA 107 01/05/2021   Lipid Panel  Lab Results  Component Value Date   CHOL 175 07/26/2024   HDL 59 07/26/2024   LDLCALC 102 (H) 07/26/2024   TRIG 53 07/26/2024    Hepatic Function Panel    Latest Ref Rng & Units 07/26/2024    9:00 AM  Hepatic Function  Total Protein 6.1 - 8.1 g/dL 6.4   AST 10 - 35 U/L 10   ALT 9 - 46 U/L 12    Total Bilirubin 0.2 - 1.2 mg/dL 0.4   Bilirubin, Direct 0.0 - 0.2 mg/dL 0.1    PSA Lab Results  Component Value Date   PSA 1.69 01/30/2024   PSA 2.48 01/13/2023   PSA 2.87 01/11/2022     No results found for any visits on 07/27/24. Assessment & Plan:   Orders Placed This Encounter  Procedures   Ambulatory referral to Podiatry    Referral Priority:   Routine    Referral Type:   Consultation    Referral Reason:   Specialty Services Required    Requested Specialty:   Podiatry    Number of Visits Requested:   1  Meds ordered this encounter  Medications   traZODone  (DESYREL ) 100 MG tablet    Sig: Take 1 tablet (100 mg total) by mouth at bedtime.    Dispense:  90 tablet    Refill:  1   Foot Issues: reportedly having foot issues.Both feet hurt daily even when not working on concrete.  Referring to Podiatry, [facility] for evaluation. Has Meloicam which he has taken daily for some time for musculoskeletal pain  Hypertension treated with Losartan -HTCZ - 100-25 mg daily. History of office hypertension. Blood pressure: normotensive 100/78. Mentions noticing his systolic blood pressures have been on the lower side.  Instructed him to halve his Losartan -HCTZ to 50-12.5 mg daily since systolic BPs have been low recently.  Hyperlipidemia with 07/26/2024 Lipid Panel: LDL 102 decreased from 108 on 01/14/24. Was taking Lipitor but was able to discontinue after losing weight.    Musculoskeletal pain treated with Mobic  15 mg daily and Flexeril  10 mg daily. Has received knee injections in the past    Vitamin-D Deficiency treated with Vitamin D 1,000 units daily and Calcium Carbonate- Vitamin D 600-400 mg daily   History of alcohol use disorder, in remission the past 8 months. Says he is still going to AA and is doing well.  Reviewed 07/24/2024 Hepatic Function Panel: WNL  Colonoscopy due.  Referral has already been placed for [facility], he only has to contact them to make an  appointment.   Vaccine Counseling: Discussed Influenza, which he says he will update in the near future.  Return in 6 months (on 01/31/2025) for annual labs, and then on 02/01/2025 for annual visit, or as needed.   I,Emily Lagle,acting as a Neurosurgeon for Ronal JINNY Hailstone, MD.,have documented all relevant documentation on the behalf of Ronal JINNY Hailstone, MD,as directed by  Ronal JINNY Hailstone, MD while in the presence of Ronal JINNY Hailstone, MD.  I, Ronal JINNY Hailstone, MD, have reviewed all documentation for this visit. The documentation on 07/27/2024 for the exam, diagnosis, procedures, and orders are all accurate and complete.

## 2024-08-02 ENCOUNTER — Encounter: Payer: Self-pay | Admitting: Podiatry

## 2024-08-02 ENCOUNTER — Ambulatory Visit (INDEPENDENT_AMBULATORY_CARE_PROVIDER_SITE_OTHER)

## 2024-08-02 ENCOUNTER — Ambulatory Visit: Admitting: Podiatry

## 2024-08-02 VITALS — Ht 69.0 in | Wt 240.0 lb

## 2024-08-02 DIAGNOSIS — M76822 Posterior tibial tendinitis, left leg: Secondary | ICD-10-CM | POA: Diagnosis not present

## 2024-08-02 DIAGNOSIS — M7751 Other enthesopathy of right foot: Secondary | ICD-10-CM | POA: Diagnosis not present

## 2024-08-02 DIAGNOSIS — M76829 Posterior tibial tendinitis, unspecified leg: Secondary | ICD-10-CM

## 2024-08-02 DIAGNOSIS — M76821 Posterior tibial tendinitis, right leg: Secondary | ICD-10-CM | POA: Diagnosis not present

## 2024-08-02 DIAGNOSIS — M7752 Other enthesopathy of left foot: Secondary | ICD-10-CM

## 2024-08-02 DIAGNOSIS — B351 Tinea unguium: Secondary | ICD-10-CM

## 2024-08-02 MED ORDER — EFINACONAZOLE 10 % EX SOLN: 1.0000 [drp] | Freq: Every day | CUTANEOUS | 11 refills | Status: AC

## 2024-08-02 NOTE — Progress Notes (Signed)
 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    Discussed the use of AI scribe software for clinical note transcription with the patient, who gave verbal consent to proceed.  History of Present Illness Matthew Huff is a 63 year old male who presents with left foot pain and flatfoot deformity.  He experiences left foot pain described as feeling like 'walking on big gravel,' with itching at times. The pain worsens around 3 PM, affecting his ability to walk and causing a limp. He has a flatfoot deformity and believes a past ankle fracture in his teens may have contributed to his current foot issues. He wore a cast for seven to eight months and suspects his arch fell during that time.  His feet improve with new shoes and self-made arch support, but pain persists, especially after prolonged standing. 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   He uses meloxicam  and Advil  for joint pain, but these do not significantly relieve his foot pain. He enjoys walking and hiking, but these activities are limited by his foot pain. He recalls a past incident at the beach where he hooked his foot on a bedspread, leading to swelling, which he associates with his previous ankle injury.  He has toenail issues, including a suspected ingrown toenail and toenail fungus, which he has not treated. He manages by trimming his toenails but finds it increasingly difficult. His father had toenail issues in later life.      Objective:    Physical Exam General: AAO x3, NAD  Dermatological: Nails are hypertrophic, dystrophic with yellow  discoloration and mild incurvation present to hallux toenails.  There is no edema, erythema or signs of infection.  No open lesions.  Vascular: Dorsalis Pedis artery and Posterior Tibial artery pedal pulses are 2/4 bilateral with immedate capillary fill time.  There is no pain with calf compression, swelling, warmth, erythema.   Neruologic: Grossly intact via light touch bilateral.   Musculoskeletal: Significant decreased medial arch height left side worse than right.  On the left side he is unable to perform a single heel rise.  He is able to do it somewhat on the right side.  There is tenderness palpation of the distal portion of posterior tibial tendon along the insertion of the navicular.  No other areas of the discomfort.  MMT 5/5.  Gait: Unassisted, Nonantalgic.     No images are attached to the encounter.    Results LABS Liver function tests: Normal  RADIOLOGY Foot X-ray: Pes planus with left foot more affected than right.  Exostosis/calcification on the anterior left ankle.    Assessment:   1. PTTD (posterior tibial tendon dysfunction)    2.      Onychomycosis  Plan:  Patient was evaluated and treated and all questions answered.  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 Pain improved with new shoes and arch support. Surgery not preferred due to recovery  time. - Create custom orthotic inserts to support arch and improve alignment.  He was seen today by Lolita, pedorthist for this. - Provide exercises to strengthen ankle tendons and muscles. - Prescribe anti-inflammatory medications as needed. - Consider MRI if symptoms persist or surgery is considered.  Right foot acquired flatfoot Right foot flatfoot less severe than left, with better posterior tibial tendon function. Likely  due to weight-bearing on hard surfaces. - Include right foot in custom orthotic insert plan for support and alignment.  Onychomycosis of toenails Chronic onychomycosis with preference for topical treatment due to concerns about oral antifungal side effects. - Prescribe topical antifungal medication, apply daily for 6-12 months. -Discussed oral options including Lamisil. - Advise monitoring for issues and contact office for cost or coverage concerns.  Return in about 2 months (around 10/02/2024).   Donnice JONELLE Fees DPM

## 2024-08-02 NOTE — Patient Instructions (Signed)
 Posterior Tibial Tendinitis Rehab Ask your health care provider which exercises are safe for you. Do exercises exactly as told by your provider and adjust them as directed. It's normal to feel mild stretching, pulling, tightness, or discomfort as you do these exercises. Stop right away if you feel sudden pain or your pain gets worse. Do not begin these exercises until told by your provider. Stretching and range-of-motion exercises These exercises warm up your muscles and joints and improve the movement and flexibility in your ankle and foot. These exercises may also help to relieve pain. Standing wall calf stretch, knee straight  Stand with your hands against a wall. Extend your left / right leg behind you, and bend your front knee slightly. If told, place a folded washcloth under the arch of your foot for support. Point the toes of your back foot slightly inward. Keeping your heels on the floor and your back knee straight, shift your weight toward the wall. Do not allow your back to arch. You should feel a gentle stretch in your upper left / right calf. Hold this position for __________ seconds. Repeat __________ times. Complete this exercise __________ times a day. Standing wall calf stretch, knee bent  Stand with your hands against a wall. Extend your left / right leg behind you, and bend your front knee slightly. If told, place a folded washcloth under the arch of your foot for support. Point the toes of your back foot slightly inward. Unlock your back knee so it's bent. Keep your heels on the floor. You should feel a gentle stretch deep in your lower left / right calf. Hold this position for __________ seconds. Repeat __________ times. Complete this exercise __________ times a day. Strengthening exercises These exercises build strength and endurance in your ankle and foot. Endurance is the ability to use your muscles for a long time, even after they get tired. Ankle inversion with  band  Secure one end of a rubber exercise band or tubing to a fixed object, such as a table leg or a pole, that will stay still when the band is pulled. Loop the other end of the band around the middle of your left / right foot. Sit on the floor facing the object with your left / right leg extended. The band or tube should be slightly tense when your foot is relaxed. Leading with your big toe, slowly bring your left / right foot and ankle inward, toward your other foot (inversion). Hold this position for __________ seconds. Slowly return your foot to the starting position. Repeat __________ times. Complete this exercise __________ times a day. Towel curls  Sit in a chair on a non-carpeted surface, and put your feet on the floor. Place a towel in front of your feet. If told by your provider, add a __________ weight to the end of the towel. Keeping your heel on the floor, put your left / right foot on the towel. Pull the towel toward you by grabbing the towel with your toes and curling them under. Keep your heel on the floor while you do this. Let your toes relax. Grab the towel with your toes again. Keep going until the towel is completely underneath your foot. Repeat __________ times. Complete this exercise __________ times a day. Balance exercise This exercise improves or maintains your balance. Balance is important in preventing falls. Single leg stand  Without wearing shoes, stand near a railing or in a doorway. You may hold on to the railing or  doorframe as needed for balance. Stand on your left / right foot. Keep your big toe down on the floor and try to keep your arch lifted. If balancing in this position is too easy, try the exercise with your eyes closed or while standing on a pillow. Hold this position for __________ seconds. Repeat __________ times. Complete this exercise __________ times a day. This information is not intended to replace advice given to you by your health care  provider. Make sure you discuss any questions you have with your health care provider. Document Revised: 11/24/2022 Document Reviewed: 11/24/2022 Elsevier Patient Education  2024 Elsevier Inc. -- Efinaconazole  Topical solution What is this medication? EFINACONAZOLE  (e FEE na KON a zole) treats fungal infections of the nails. It belongs to a group of medications called antifungals. It will not treat infections caused by bacteria or viruses. This medicine may be used for other purposes; ask your health care provider or pharmacist if you have questions. COMMON BRAND NAME(S): JUBLIA  What should I tell my care team before I take this medication? They need to know if you have any of these conditions: An unusual or allergic reaction to efinaconazole , other medications, foods, dyes or preservatives Pregnant or trying to get pregnant Breast-feeding How should I use this medication? This medication is for external use only. Do not take by mouth. Wash your hands before and after use. Do not get it in your eyes. If you do, rinse your eyes with plenty of cool tap water. Use it as directed on the prescription label. Do not use it more often than directed. Use the medication for the full course as directed by your care team, even if you think you are better. Do not stop using it unless your care team tells you to stop it early. This medication comes with INSTRUCTIONS FOR USE. Ask your pharmacist for directions on how to use this medication. Read the information carefully. Talk to your pharmacist or care team if you have questions. Talk to your care team about the use of this medication in children. While it may be prescribed for children as young as 6 years for selected conditions, precautions do apply. Overdosage: If you think you have taken too much of this medicine contact a poison control center or emergency room at once. NOTE: This medicine is only for you. Do not share this medicine with others. What if  I miss a dose? If you miss a dose, use it as soon as you can. If it is almost time for your next dose, use only that dose. Do not use double or extra doses. What may interact with this medication? Interactions are not expected. Do not use any other skin products on the same area of skin without talking to your care team. This list may not describe all possible interactions. Give your health care provider a list of all the medicines, herbs, non-prescription drugs, or dietary supplements you use. Also tell them if you smoke, drink alcohol, or use illegal drugs. Some items may interact with your medicine. What should I watch for while using this medication? Visit your care team for regular checks on your progress. It may be some time before you see the benefit from this medication. Do not use nail polish or other nail cosmetic products on the treated nails. What side effects may I notice from receiving this medication? Side effects that you should report to your care team as soon as possible: Allergic reactions--skin rash, itching, hives, swelling of the  face, lips, tongue, or throat Side effects that usually do not require medical attention (report to your care team if they continue or are bothersome): Ingrown nails Mild skin irritation, redness, or dryness This list may not describe all possible side effects. Call your doctor for medical advice about side effects. You may report side effects to FDA at 1-800-FDA-1088. Where should I keep my medication? Keep out of the reach of children and pets. Store at room temperature between 20 and 25 degrees C (68 and 77 degrees F). Do not freeze. Keep the container tightly closed. Get rid of any unused medication after the expiration date. This medication is flammable. Avoid exposure to heat, flame, and smoking. To get rid of medications that are no longer needed or have expired: Take the medication to a medication take-back program. Check with your pharmacy  or law enforcement to find a location. If you cannot return the medication, ask your pharmacist or care team how to get rid of this medication safely. NOTE: This sheet is a summary. It may not cover all possible information. If you have questions about this medicine, talk to your doctor, pharmacist, or health care provider.  2024 Elsevier/Gold Standard (2022-02-01 00:00:00)

## 2024-08-05 NOTE — Patient Instructions (Signed)
 We are referring you to Dr Gershon at [facility] for bilateral foot pain. You may continue with Meloxicam . Decrease Losartan /HCTZ to half of 100-25 tab daily and monitor blood pressure daily. Referral placed for colonoscopy. Flu vaccine due in the Fall. Return for health maintenance exam in 6 months. It was a pleasure to see you today and glad you are doing well in Recovery.

## 2024-08-13 ENCOUNTER — Other Ambulatory Visit: Payer: Self-pay | Admitting: Podiatry

## 2024-08-13 ENCOUNTER — Telehealth: Payer: Self-pay

## 2024-08-13 MED ORDER — CICLOPIROX 8 % EX SOLN: Freq: Every day | CUTANEOUS | 2 refills | Status: AC

## 2024-08-13 NOTE — Telephone Encounter (Signed)
PA request was denied.

## 2024-08-30 ENCOUNTER — Ambulatory Visit (INDEPENDENT_AMBULATORY_CARE_PROVIDER_SITE_OTHER)

## 2024-08-30 NOTE — Progress Notes (Signed)
 Patient presents today to pick up custom molded foot orthotics, diagnosed with PTTD Left foot and ankle by Dr. Gershon.   Orthotics were dispensed and fit was satisfactory, however patient was expecting more medial support for Left ankle to help control and support overpronation, I will re-make Left orthotic with Higher flange and add scaphoid pad  We will call patient when in to come try on  Reviewed instructions for break-in and wear. Written instructions given to patient.  Patient will follow up as needed.   Lolita Schultze CPed, CFo, CFm

## 2024-09-21 ENCOUNTER — Telehealth: Payer: Self-pay

## 2024-09-21 NOTE — Telephone Encounter (Signed)
 Orthotics are here Sport and exercise psychologist form signed and on file Appt needed

## 2024-10-02 ENCOUNTER — Encounter: Payer: Self-pay | Admitting: Podiatry

## 2024-10-02 ENCOUNTER — Ambulatory Visit (INDEPENDENT_AMBULATORY_CARE_PROVIDER_SITE_OTHER)

## 2024-10-02 ENCOUNTER — Ambulatory Visit: Admitting: Podiatry

## 2024-10-02 VITALS — BP 130/80 | HR 78 | Ht 69.0 in | Wt 240.0 lb

## 2024-10-02 DIAGNOSIS — Z23 Encounter for immunization: Secondary | ICD-10-CM | POA: Diagnosis not present

## 2024-10-02 DIAGNOSIS — B351 Tinea unguium: Secondary | ICD-10-CM | POA: Diagnosis not present

## 2024-10-02 DIAGNOSIS — M76829 Posterior tibial tendinitis, unspecified leg: Secondary | ICD-10-CM | POA: Diagnosis not present

## 2024-10-02 NOTE — Patient Instructions (Signed)

## 2024-10-02 NOTE — Progress Notes (Signed)
  Subjective:  Patient ID: Matthew Huff, male    DOB: [birth year],  MRN: [id number]  Chief Complaint  Patient presents with   Foot Pain    Rm14  2 month f/u left ankle some improvement / orthotic pick up     History of Present Illness Matthew Huff is a 63 year old male who presents with left foot pain and flatfoot deformity.  He states that the insert that was made did not fit appropriately and he presents today to pick up a new orthotic that was remade for him.  When he wears that over-the-counter insert he adds a piece of cardboard underneath to help increase the arch this does seem to help.  He was active over the weekend and despite being active he was able to walk without any significant pain until the end of the day.  He currently denies any recent injuries.  He states that he knows this may take time to heal.  He been using the topical medication for nail fungus intermittently.   Objective:    Physical Exam General: AAO x3, NAD  Dermatological: Mild clearing on the proximal nail folds.  Vascular: Dorsalis Pedis artery and Posterior Tibial artery pedal pulses are 2/4 bilateral with immedate capillary fill time.  There is no pain with calf compression, swelling, warmth, erythema.   Neruologic: Grossly intact via light touch bilateral.   Musculoskeletal: Significant decreased medial arch height left side worse than right.  There is no significant tenderness to palpation of the distal portion of posterior tibial tendon along the insertion of the navicular.  No other areas of the discomfort.  MMT 5/5.  Gait: Unassisted, Nonantalgic.    No images are attached to the encounter.   Assessment:   1. PTTD (posterior tibial tendon dysfunction)    2.      Onychomycosis  Plan:  Patient was evaluated and treated and all questions answered.  Assessment and Plan Assessment & Plan Left foot acquired flatfoot with posterior tibial tendon dysfunction -His new  orthotic was dispensed today.  Break-in instructions were provided to the patient.  Continue supportive shoe gear as well.  Anti-inflammatories as needed.   Onychomycosis of toenails Chronic onychomycosis with preference for topical treatment due to concerns about oral antifungal side effects. - Continue topical medication for nail fungus.  Discussed importance to use this on a regular basis to make it more effective.  Return in about 2 months (around 12/02/2024).  Donnice JONELLE Fees DPM

## 2024-10-02 NOTE — Progress Notes (Signed)
 Patient presents to the office for a flu vaccine. Patient received a flu vaccine left deltoid, patient tolerated well.

## 2024-10-12 ENCOUNTER — Telehealth: Payer: Self-pay

## 2024-10-12 NOTE — Telephone Encounter (Signed)
 Called patient because he accidentally left his old orthotic here. He said he will pu when he visits us  on 12/29

## 2024-10-29 ENCOUNTER — Encounter: Payer: Self-pay | Admitting: Internal Medicine

## 2024-10-29 ENCOUNTER — Ambulatory Visit: Admitting: Internal Medicine

## 2024-10-29 VITALS — BP 120/80 | HR 94 | Temp 98.3°F | Ht 69.0 in | Wt 240.0 lb

## 2024-10-29 DIAGNOSIS — W57XXXA Bitten or stung by nonvenomous insect and other nonvenomous arthropods, initial encounter: Secondary | ICD-10-CM

## 2024-10-29 DIAGNOSIS — S30861A Insect bite (nonvenomous) of abdominal wall, initial encounter: Secondary | ICD-10-CM | POA: Diagnosis not present

## 2024-10-29 DIAGNOSIS — I10 Essential (primary) hypertension: Secondary | ICD-10-CM

## 2024-10-29 MED ORDER — DOXYCYCLINE HYCLATE 100 MG PO TABS
100.0000 mg | ORAL_TABLET | Freq: Two times a day (BID) | ORAL | 0 refills | Status: AC
Start: 2024-10-29 — End: ?

## 2024-10-29 NOTE — Progress Notes (Signed)
 Patient Care Team: Perri Ronal PARAS, MD as PCP - General (Internal Medicine)  Visit Date: 10/29/24  Subjective:    Patient ID: Matthew Huff , Male   DOB: 15-Mar-1961, 63 y.o.    MRN: [id number]   63 y.o. Male presents today for insect bite. Patient has a past medical history of Hypertension, Hyperlipidemia, Arthritis.  He woke up one Sunday morning and saw that a tick had bitten him in the umbilical area. He also said that later he had chills and was diaphoretic after getting bitten but he doesn't remember how long after discovering bite that he developed these symptoms.  He denies having any fever. He said that he felt poorly for three days.  Other people at work were also ill. He said that today he feels fine.     Vaccine counseling: UTD on Influenza vaccine, Shingles vaccine due.    Health maintenance: Colonoscopy due.   Past Medical History:  Diagnosis Date   HTN (hypertension), benign    HTN (hypertension), benign    Left inguinal hernia 02/04/2011     Family History  Problem Relation Age of Onset   Diabetes Mother    Hypertension Mother    Hyperlipidemia Mother    Heart disease Mother    Hypertension Father    Hyperlipidemia Father    Diverticulitis Father    Stroke Father    Hypertension Sister     Social History   Social History Narrative   Divorced. 1 adult daughter who is married. [place] , formerly lived in [place].  Patient resides alone. Enjoys fishing. 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 Social alcohol consumption.      01/31/2024 - Has been taking care of a puppy for a little while now for someone, and is now thinking about getting one himself for a companion. 8 months sober.       Family history: Mother with history of diabetes and coronary artery disease died of congestive heart failure complications.  Father deceased with history of hypertension, atherosclerotic cardiovascular  disease and dementia.  1 sister overweight.              Review of Systems  Skin:        Insect bite.    No fever chills. Malaise or fatigue.     Objective:   Vitals: BP 120/80   Pulse 94   Temp 98.3 F (36.8 C)   Ht 5' 9 (1.753 m)   Wt 240 lb (108.9 kg)   SpO2 96%   BMI 35.44 kg/m    Physical Exam Constitutional:      General: He is not in acute distress.    Appearance: Normal appearance. He is not ill-appearing.  HENT:     Head: Normocephalic and atraumatic.  Cardiovascular:     Rate and Rhythm: Normal rate and regular rhythm.     Pulses: Normal pulses.     Heart sounds: Normal heart sounds. No murmur heard.    No friction rub. No gallop.  Pulmonary:     Effort: Pulmonary effort is normal. No respiratory distress.     Breath sounds: Normal breath sounds. No wheezing or rales.  Skin:    General: Skin is warm and dry.     Findings: Wound present.  Neurological:     Mental Status: He is alert and oriented to person, place, and time. Mental status is at baseline.  Psychiatric:  Mood and Affect: Mood normal.        Behavior: Behavior normal.        Thought Content: Thought content normal.        Judgment: Judgment normal.       Results:    Labs:       Component Value Date/Time   NA 139 01/30/2024 0909   K 4.0 01/30/2024 0909   CL 105 01/30/2024 0909   CO2 24 01/30/2024 0909   GLUCOSE 97 01/30/2024 0909   BUN 14 01/30/2024 0909   CREATININE 0.83 01/30/2024 0909   CALCIUM 9.4 01/30/2024 0909   PROT 6.4 07/26/2024 0900   ALBUMIN 5.1 06/13/2017 0902   AST 10 07/26/2024 0900   ALT 12 07/26/2024 0900   ALKPHOS 43 06/13/2017 0902   BILITOT 0.4 07/26/2024 0900   GFRNONAA 107 01/05/2021 1134   GFRAA 124 01/05/2021 1134     Lab Results  Component Value Date   WBC 9.9 01/30/2024   HGB 15.8 01/30/2024   HCT 46.9 01/30/2024   MCV 91.4 01/30/2024   PLT 243 01/30/2024    Lab Results  Component Value Date   CHOL 175 07/26/2024   HDL 59  07/26/2024   LDLCALC 102 (H) 07/26/2024   TRIG 53 07/26/2024   CHOLHDL 3.0 07/26/2024     Lab Results  Component Value Date   PSA 1.69 01/30/2024   PSA 2.48 01/13/2023   PSA 2.87 01/11/2022     Assessment & Plan:   Meds ordered this encounter  Medications   doxycycline  (VIBRA -TABS) 100 MG tablet    Sig: Take 1 tablet (100 mg total) by mouth 2 (two) times daily.    Dispense:  14 tablet    Refill:  0   Insect bite: He woke up one Sunday morning and saw that a tick had bitten him  and was attached in the umbilical area. He also said that he had chills and was diaphoretic after getting bitten but he doesn't remember how long after discovering bite that he developed symptoms. Coworkers have been ill. No one tested themselves for Covid-19.SABRA  He denies having any fever. He said that he felt ill  for three days. He said that today he feels fine. Currently no headache, fever, chills, myalgias.    Plan: Doxycyline 100 mg twice daily  for 7 days prescribed.   Vaccine counseling: UTD on Influenza vaccine. Shingles vaccine due. Consider Covid booster.  Health maintenance: Colonoscopy due. Will make referral when patient is ready to proceed..  Essential HTN- BP stable on Hyzzar 100/25 daily  Hx of alcohol use disorder- remains sober and attends AA meetings  BMI 35- needs to lose weight. Is physically active.  I,Makayla C Reid,acting as a scribe for Ronal JINNY Hailstone, MD.,have documented all relevant documentation on the behalf of Ronal JINNY Hailstone, MD,as directed by  Ronal JINNY Hailstone, MD while in the presence of Ronal JINNY Hailstone, MD.  I, Ronal JINNY Hailstone, MD, have reviewed all documentation for this visit. The documentation on 10/29/2024 for the exam, diagnosis, procedures, and orders are all accurate and complete.    IRonal JINNY Hailstone, MD, have reviewed all documentation for this visit. The documentation on 10/29/2024 for the exam, diagnosis, procedures, and orders are all accurate and complete.

## 2024-10-29 NOTE — Patient Instructions (Addendum)
 Prescribed Doxycycline  100 mg twice daily for  7 days for recent tick bite. May have had viral syndrome as opposed to tick borne illness.Colonoscopy due. Has had influenza vaccine. Call if symptoms recur or if you have any questions or concerns.Continue antihypertensive medication.

## 2024-11-15 ENCOUNTER — Other Ambulatory Visit: Payer: Self-pay | Admitting: Internal Medicine

## 2024-12-03 ENCOUNTER — Ambulatory Visit: Admitting: Podiatry

## 2024-12-19 ENCOUNTER — Other Ambulatory Visit: Payer: Self-pay | Admitting: Internal Medicine

## 2024-12-31 ENCOUNTER — Ambulatory Visit: Admitting: Podiatry

## 2025-01-08 ENCOUNTER — Ambulatory Visit: Admitting: Podiatry

## 2025-01-10 ENCOUNTER — Ambulatory Visit: Admitting: Podiatry

## 2025-01-10 NOTE — Progress Notes (Unsigned)
 Not bad

## 2025-01-10 NOTE — Patient Instructions (Signed)
 Posterior Tibial Tendon Tear Rehab Make sure you know how to do the exercises safely. Follow the steps below. It's normal to feel mild discomfort. Stop if you feel pain or if your pain gets worse.  Do not start these exercises until told by your health care provider. Your provider will tell you how many times to do these exercises each day. Stretching and range-of-motion exercises These exercises warm up your muscles and joints and improve the movement and flexibility of your ankle. These exercises also help to relieve pain, numbness, and tingling. Gastrocnemius stretch  Sit on the floor with your left / right leg extended. Loop a belt or towel around ball of your left / right foot. The ball of your foot is on the walking surface, right under your toes. Keep your left / right ankle and foot relaxed and keep your knee straight while you use the belt or towel to pull your foot and ankle toward you. You should feel a gentle stretch behind your calf or knee (gastrocnemius). Hold this stretch for as long as you've been told. Active ankle dorsiflexion and plantar flexion  Sit with your left / right knee straight or bent. Do not rest your foot on anything. Flex your left / right ankle to tilt the top of your foot toward your shin (dorsiflexion). Hold this stretch for as long as you've been told. Point your toes downward to tilt the top of your foot away from your shin (plantar flexion). Hold this stretch for as long as you've been told. Repeat this stretch with your knee straight and repeat it with your knee bent. Ask how many times you should repeat the stretches. Passive ankle plantar flexion  Sit with your left / right leg crossed over your opposite knee. With your left / right hand, pull the front of your foot and toes toward you (plantar flexion). You should feel a gentle stretch on the top of your foot and ankle. Hold this stretch for as long as you've been told. Passive ankle eversion  Sit  with your left / right ankle crossed over your opposite knee. With your left / right hand, hold your foot so that your thumb is on the top of your foot and your fingers are on the bottom of your foot. Gently push and twist your ankle downward (eversion). You should feel a gentle stretch on the inside of your ankle. Hold this stretch for as long as you've been told. Passive ankle inversion  Sit with your left / right ankle crossed over your opposite knee. With your left / right hand, hold your foot so that your thumb is on the bottom of your foot and your fingers are across the top of your foot. Gently pull and twist your ankle upward (inversion). You should feel a gentle stretch on the outside of your ankle. Hold this stretch for as long as you've been told. Ankle alphabet  Sit with your left / right leg supported at the lower leg. Do not rest your foot on anything. Make sure your foot has room to move freely. Think of your left / right foot as a paintbrush, and move your foot to trace each letter of the alphabet in the air. Keep your hip and knee still while you trace. Trace every letter of the alphabet. Strengthening exercises These exercises build strength and endurance in your lower leg. Endurance is the ability to use your muscles for a long time, even after they get tired. Dorsiflexion  Secure a rubber exercise band or tube to an object that will not move if it is pulled on, such as a table leg. Secure the other end of the band around your left / right foot. Sit on the floor, facing the object with your left / right leg extended. The band or tube should be slightly tense when your foot is relaxed. Slowly flex your left / right ankle and toes to bring your foot toward you (dorsiflexion). Hold this position for as long as you've been told. Let the band or tube slowly pull your foot back to the starting position. Plantar flexion while seated  Sit on the floor with your left / right  leg extended. Loop a rubber exercise band or tube around the ball of your left / right foot. The ball of your foot is on the walking surface, right under your toes. The band or tube should be slightly tense when your foot is relaxed. Slowly point your toes downward, pushing them away from you (plantar flexion). Hold this position for as long as you've been told. Let the band or tube slowly pull your foot back to the starting position. Towel curls  Sit in a chair on a non-carpeted surface, and put your feet on the floor. Place a towel in front of your feet. If told by your provider, add a weight to the end of the towel. Ask how much weight you should use. Keeping your heel on the floor, put your left / right foot on the towel. Pull the towel toward you by grabbing the towel with your toes and curling them under. Keep your heel on the floor. This information is not intended to replace advice given to you by your health care provider. Make sure you discuss any questions you have with your health care provider. Document Revised: 06/22/2024 Document Reviewed: 06/22/2024 Elsevier Patient Education  2025 [employer].

## 2025-01-31 ENCOUNTER — Other Ambulatory Visit: Payer: Self-pay

## 2025-02-01 ENCOUNTER — Encounter: Payer: Self-pay | Admitting: Internal Medicine

## 2025-04-09 ENCOUNTER — Ambulatory Visit: Admitting: Podiatry
